# Patient Record
Sex: Male | Born: 1955
Health system: Southern US, Community
[De-identification: ages and names within clinical notes are randomized; demographics above are authoritative.]

## PROBLEM LIST (undated history)

## (undated) DIAGNOSIS — G4733 Obstructive sleep apnea (adult) (pediatric): Secondary | ICD-10-CM

## (undated) DIAGNOSIS — E785 Hyperlipidemia, unspecified: Secondary | ICD-10-CM

## (undated) DIAGNOSIS — L570 Actinic keratosis: Secondary | ICD-10-CM

## (undated) HISTORY — DX: Actinic keratosis: L57.0

## (undated) HISTORY — PX: VASECTOMY: SHX75

## (undated) HISTORY — DX: Hyperlipidemia, unspecified: E78.5

## (undated) HISTORY — DX: Obstructive sleep apnea (adult) (pediatric): G47.33

---

## 1996-10-31 HISTORY — PX: NASAL SEPTUM SURGERY: SHX37

## 2003-04-10 ENCOUNTER — Ambulatory Visit (HOSPITAL_BASED_OUTPATIENT_CLINIC_OR_DEPARTMENT_OTHER): Admission: RE | Admit: 2003-04-10 | Discharge: 2003-04-10 | Payer: Self-pay | Admitting: Pulmonary Disease

## 2003-08-06 ENCOUNTER — Encounter: Payer: Self-pay | Admitting: Pulmonary Disease

## 2003-08-06 ENCOUNTER — Observation Stay (HOSPITAL_COMMUNITY): Admission: EM | Admit: 2003-08-06 | Discharge: 2003-08-07 | Payer: Self-pay | Admitting: Pulmonary Disease

## 2005-05-26 ENCOUNTER — Ambulatory Visit: Payer: Self-pay | Admitting: Pulmonary Disease

## 2006-06-23 ENCOUNTER — Ambulatory Visit: Payer: Self-pay | Admitting: Pulmonary Disease

## 2007-01-11 ENCOUNTER — Ambulatory Visit: Payer: Self-pay | Admitting: Pulmonary Disease

## 2007-01-11 LAB — CONVERTED CEMR LAB
Cholesterol: 225 mg/dL (ref 0–200)
Direct LDL: 142.8 mg/dL
HDL: 44.7 mg/dL (ref >39.0)
Hgb A1c MFr Bld: 5.6 % (ref 4.6–6.0)
Total CHOL/HDL Ratio: 5
Triglycerides: 133 mg/dL (ref 0–149)
VLDL: 27 mg/dL (ref 0–40)

## 2007-06-28 ENCOUNTER — Ambulatory Visit: Payer: Self-pay | Admitting: Pulmonary Disease

## 2007-06-28 LAB — CONVERTED CEMR LAB
ALT: 39 units/L (ref 0–53)
AST: 28 units/L (ref 0–37)
Albumin: 4.1 g/dL (ref 3.5–5.2)
Alkaline Phosphatase: 58 units/L (ref 39–117)
BUN: 11 mg/dL (ref 6–23)
Basophils Absolute: 0 10*3/uL (ref 0.0–0.1)
Basophils Relative: 0.7 % (ref 0.0–1.0)
Bilirubin, Direct: 0.1 mg/dL (ref 0.0–0.3)
CO2: 33 meq/L — ABNORMAL HIGH (ref 19–32)
Calcium: 9.1 mg/dL (ref 8.4–10.5)
Chloride: 106 meq/L (ref 96–112)
Cholesterol: 212 mg/dL (ref 0–200)
Creatinine, Ser: 1.1 mg/dL (ref 0.4–1.5)
Direct LDL: 139.8 mg/dL
Eosinophils Absolute: 0.2 10*3/uL (ref 0.0–0.6)
Eosinophils Relative: 3.5 % (ref 0.0–5.0)
GFR calc Af Amer: 91 mL/min
GFR calc non Af Amer: 75 mL/min
Glucose, Bld: 121 mg/dL — ABNORMAL HIGH (ref 70–99)
HCT: 46.9 % (ref 39.0–52.0)
HDL: 44 mg/dL (ref >39.0)
Hemoglobin: 16 g/dL (ref 13.0–17.0)
Lymphocytes Relative: 27.3 % (ref 12.0–46.0)
MCHC: 34.2 g/dL (ref 30.0–36.0)
MCV: 90.7 fL (ref 78.0–100.0)
Monocytes Absolute: 0.4 10*3/uL (ref 0.2–0.7)
Monocytes Relative: 7.6 % (ref 3.0–11.0)
Neutro Abs: 2.9 10*3/uL (ref 1.4–7.7)
Neutrophils Relative %: 60.9 % (ref 43.0–77.0)
PSA: 0.92 ng/mL (ref 0.10–4.00)
Platelets: 286 10*3/uL (ref 150–400)
Potassium: 4.6 meq/L (ref 3.5–5.1)
RBC: 5.17 M/uL (ref 4.22–5.81)
RDW: 12.2 % (ref 11.5–14.6)
Sodium: 142 meq/L (ref 135–145)
TSH: 0.58 microintl units/mL (ref 0.35–5.50)
Total Bilirubin: 0.9 mg/dL (ref 0.3–1.2)
Total CHOL/HDL Ratio: 4.8
Total Protein: 7 g/dL (ref 6.0–8.3)
Triglycerides: 144 mg/dL (ref 0–149)
VLDL: 29 mg/dL (ref 0–40)
WBC: 4.8 10*3/uL (ref 4.5–10.5)

## 2007-07-20 ENCOUNTER — Ambulatory Visit: Payer: Self-pay

## 2008-05-15 DIAGNOSIS — R7301 Impaired fasting glucose: Secondary | ICD-10-CM | POA: Insufficient documentation

## 2008-05-16 ENCOUNTER — Ambulatory Visit: Payer: Self-pay | Admitting: Pulmonary Disease

## 2008-05-16 DIAGNOSIS — L570 Actinic keratosis: Secondary | ICD-10-CM | POA: Insufficient documentation

## 2008-05-16 DIAGNOSIS — G4733 Obstructive sleep apnea (adult) (pediatric): Secondary | ICD-10-CM

## 2008-09-03 ENCOUNTER — Encounter: Payer: Self-pay | Admitting: Pulmonary Disease

## 2008-10-31 HISTORY — PX: ROTATOR CUFF REPAIR: SHX139

## 2008-12-19 ENCOUNTER — Encounter: Payer: Self-pay | Admitting: Pulmonary Disease

## 2009-09-09 ENCOUNTER — Encounter: Payer: Self-pay | Admitting: Pulmonary Disease

## 2009-10-12 ENCOUNTER — Telehealth: Payer: Self-pay | Admitting: Pulmonary Disease

## 2009-10-14 ENCOUNTER — Ambulatory Visit: Payer: Self-pay | Admitting: Pulmonary Disease

## 2009-10-19 ENCOUNTER — Telehealth (INDEPENDENT_AMBULATORY_CARE_PROVIDER_SITE_OTHER): Payer: Self-pay | Admitting: *Deleted

## 2009-10-21 ENCOUNTER — Ambulatory Visit: Payer: Self-pay | Admitting: Pulmonary Disease

## 2009-10-26 LAB — CONVERTED CEMR LAB
ALT: 63 units/L — ABNORMAL HIGH (ref 0–53)
AST: 28 units/L (ref 0–37)
Albumin: 3.5 g/dL (ref 3.5–5.2)
Alkaline Phosphatase: 68 units/L (ref 39–117)
BUN: 13 mg/dL (ref 6–23)
Basophils Absolute: 0 10*3/uL (ref 0.0–0.1)
Basophils Relative: 0.5 % (ref 0.0–3.0)
Bilirubin Urine: NEGATIVE
Bilirubin, Direct: 0.1 mg/dL (ref 0.0–0.3)
CO2: 30 meq/L (ref 19–32)
Calcium: 9.2 mg/dL (ref 8.4–10.5)
Chloride: 104 meq/L (ref 96–112)
Cholesterol: 173 mg/dL (ref 0–200)
Creatinine, Ser: 1 mg/dL (ref 0.4–1.5)
Eosinophils Absolute: 0.2 10*3/uL (ref 0.0–0.7)
Eosinophils Relative: 4 % (ref 0.0–5.0)
GFR calc non Af Amer: 83.24 mL/min (ref >60)
Glucose, Bld: 107 mg/dL — ABNORMAL HIGH (ref 70–99)
HCT: 45.8 % (ref 39.0–52.0)
HDL: 33.1 mg/dL — ABNORMAL LOW (ref >39.00)
Hemoglobin: 15.9 g/dL (ref 13.0–17.0)
Ketones, ur: NEGATIVE mg/dL
LDL Cholesterol: 119 mg/dL — ABNORMAL HIGH (ref 0–99)
Leukocytes, UA: NEGATIVE
Lymphocytes Relative: 23.5 % (ref 12.0–46.0)
Lymphs Abs: 1.2 10*3/uL (ref 0.7–4.0)
MCHC: 34.6 g/dL (ref 30.0–36.0)
MCV: 92.2 fL (ref 78.0–100.0)
Monocytes Absolute: 0.6 10*3/uL (ref 0.1–1.0)
Monocytes Relative: 10.9 % (ref 3.0–12.0)
Neutro Abs: 3.1 10*3/uL (ref 1.4–7.7)
Neutrophils Relative %: 61.1 % (ref 43.0–77.0)
Nitrite: NEGATIVE
PSA: 0.68 ng/mL (ref 0.10–4.00)
Platelets: 237 10*3/uL (ref 150.0–400.0)
Potassium: 4.9 meq/L (ref 3.5–5.1)
RBC: 4.97 M/uL (ref 4.22–5.81)
RDW: 11.5 % (ref 11.5–14.6)
Sodium: 141 meq/L (ref 135–145)
Specific Gravity, Urine: 1.015 (ref 1.000–1.030)
TSH: 0.79 microintl units/mL (ref 0.35–5.50)
Total Bilirubin: 0.7 mg/dL (ref 0.3–1.2)
Total CHOL/HDL Ratio: 5
Total Protein, Urine: NEGATIVE mg/dL
Total Protein: 6.8 g/dL (ref 6.0–8.3)
Triglycerides: 104 mg/dL (ref 0.0–149.0)
Urine Glucose: NEGATIVE mg/dL
Urobilinogen, UA: 0.2 (ref 0.0–1.0)
VLDL: 20.8 mg/dL (ref 0.0–40.0)
WBC: 5.1 10*3/uL (ref 4.5–10.5)
pH: 6.5 (ref 5.0–8.0)

## 2010-01-12 ENCOUNTER — Encounter (INDEPENDENT_AMBULATORY_CARE_PROVIDER_SITE_OTHER): Payer: Self-pay | Admitting: *Deleted

## 2010-08-03 ENCOUNTER — Ambulatory Visit: Payer: Self-pay | Admitting: Pulmonary Disease

## 2010-08-03 ENCOUNTER — Encounter: Payer: Self-pay | Admitting: Pulmonary Disease

## 2010-08-04 LAB — CONVERTED CEMR LAB
ALT: 28 units/L (ref 0–53)
AST: 27 units/L (ref 0–37)
Albumin: 4.1 g/dL (ref 3.5–5.2)
Alkaline Phosphatase: 51 units/L (ref 39–117)
BUN: 14 mg/dL (ref 6–23)
Basophils Absolute: 0 10*3/uL (ref 0.0–0.1)
Basophils Relative: 0.3 % (ref 0.0–3.0)
Bilirubin Urine: NEGATIVE
Bilirubin, Direct: 0.2 mg/dL (ref 0.0–0.3)
CO2: 30 meq/L (ref 19–32)
Calcium: 9.2 mg/dL (ref 8.4–10.5)
Chloride: 105 meq/L (ref 96–112)
Cholesterol: 131 mg/dL (ref 0–200)
Creatinine, Ser: 1 mg/dL (ref 0.4–1.5)
Eosinophils Absolute: 0.2 10*3/uL (ref 0.0–0.7)
Eosinophils Relative: 3.1 % (ref 0.0–5.0)
GFR calc non Af Amer: 82.98 mL/min (ref >60)
Glucose, Bld: 107 mg/dL — ABNORMAL HIGH (ref 70–99)
HCT: 45.1 % (ref 39.0–52.0)
HDL: 50.9 mg/dL (ref >39.00)
Hemoglobin, Urine: NEGATIVE
Hemoglobin: 15.7 g/dL (ref 13.0–17.0)
Ketones, ur: NEGATIVE mg/dL
LDL Cholesterol: 67 mg/dL (ref 0–99)
Leukocytes, UA: NEGATIVE
Lymphocytes Relative: 26.7 % (ref 12.0–46.0)
Lymphs Abs: 1.3 10*3/uL (ref 0.7–4.0)
MCHC: 34.9 g/dL (ref 30.0–36.0)
MCV: 93 fL (ref 78.0–100.0)
Monocytes Absolute: 0.4 10*3/uL (ref 0.1–1.0)
Monocytes Relative: 7.6 % (ref 3.0–12.0)
Neutro Abs: 3.1 10*3/uL (ref 1.4–7.7)
Neutrophils Relative %: 62.3 % (ref 43.0–77.0)
Nitrite: NEGATIVE
PSA: 0.77 ng/mL (ref 0.10–4.00)
Platelets: 259 10*3/uL (ref 150.0–400.0)
Potassium: 4.6 meq/L (ref 3.5–5.1)
RBC: 4.85 M/uL (ref 4.22–5.81)
RDW: 12.8 % (ref 11.5–14.6)
Sodium: 140 meq/L (ref 135–145)
Specific Gravity, Urine: 1.015 (ref 1.000–1.030)
TSH: 0.61 microintl units/mL (ref 0.35–5.50)
Total Bilirubin: 0.9 mg/dL (ref 0.3–1.2)
Total CHOL/HDL Ratio: 3
Total Protein, Urine: NEGATIVE mg/dL
Total Protein: 6.9 g/dL (ref 6.0–8.3)
Triglycerides: 66 mg/dL (ref 0.0–149.0)
Urine Glucose: NEGATIVE mg/dL
Urobilinogen, UA: 0.2 (ref 0.0–1.0)
VLDL: 13.2 mg/dL (ref 0.0–40.0)
WBC: 4.9 10*3/uL (ref 4.5–10.5)
pH: 7.5 (ref 5.0–8.0)

## 2010-11-28 LAB — CONVERTED CEMR LAB
ALT: 37 units/L (ref 0–53)
AST: 25 units/L (ref 0–37)
Albumin: 4.3 g/dL (ref 3.5–5.2)
Alkaline Phosphatase: 49 units/L (ref 39–117)
BUN: 14 mg/dL (ref 6–23)
Basophils Absolute: 0 10*3/uL (ref 0.0–0.1)
Basophils Relative: 0.5 % (ref 0.0–3.0)
Bilirubin Urine: NEGATIVE
Bilirubin, Direct: 0.1 mg/dL (ref 0.0–0.3)
CO2: 31 meq/L (ref 19–32)
Calcium: 9.4 mg/dL (ref 8.4–10.5)
Chloride: 103 meq/L (ref 96–112)
Cholesterol: 135 mg/dL (ref 0–200)
Creatinine, Ser: 1 mg/dL (ref 0.4–1.5)
Crystals: NEGATIVE
Eosinophils Absolute: 0.1 10*3/uL (ref 0.0–0.7)
Eosinophils Relative: 3.1 % (ref 0.0–5.0)
GFR calc Af Amer: 101 mL/min
GFR calc non Af Amer: 84 mL/min
Glucose, Bld: 116 mg/dL — ABNORMAL HIGH (ref 70–99)
HCT: 47.5 % (ref 39.0–52.0)
HDL: 41.5 mg/dL (ref >39.0)
Hemoglobin, Urine: NEGATIVE
Hemoglobin: 16.3 g/dL (ref 13.0–17.0)
Ketones, ur: NEGATIVE mg/dL
LDL Cholesterol: 78 mg/dL (ref 0–99)
Leukocytes, UA: NEGATIVE
Lymphocytes Relative: 30.3 % (ref 12.0–46.0)
MCHC: 34.2 g/dL (ref 30.0–36.0)
MCV: 93.6 fL (ref 78.0–100.0)
Monocytes Absolute: 0.3 10*3/uL (ref 0.1–1.0)
Monocytes Relative: 6.8 % (ref 3.0–12.0)
Neutro Abs: 2.9 10*3/uL (ref 1.4–7.7)
Neutrophils Relative %: 59.3 % (ref 43.0–77.0)
Nitrite: NEGATIVE
PSA: 0.59 ng/mL (ref 0.10–4.00)
Platelets: 273 10*3/uL (ref 150–400)
Potassium: 4.4 meq/L (ref 3.5–5.1)
RBC: 5.08 M/uL (ref 4.22–5.81)
RDW: 12.2 % (ref 11.5–14.6)
Sodium: 139 meq/L (ref 135–145)
Specific Gravity, Urine: 1.015 (ref 1.000–1.03)
TSH: 0.7 microintl units/mL (ref 0.35–5.50)
Total Bilirubin: 1.2 mg/dL (ref 0.3–1.2)
Total CHOL/HDL Ratio: 3.3
Total Protein: 7.2 g/dL (ref 6.0–8.3)
Triglycerides: 79 mg/dL (ref 0–149)
Urine Glucose: NEGATIVE mg/dL
Urobilinogen, UA: 0.2 (ref 0.0–1.0)
VLDL: 16 mg/dL (ref 0–40)
WBC: 4.7 10*3/uL (ref 4.5–10.5)
pH: 7 (ref 5.0–8.0)

## 2010-11-30 NOTE — Assessment & Plan Note (Signed)
Summary: cpx/ mbw   CC:  Yearly ROV & CPX....  History of Present Illness: 55 y/o WM here for a follow up visit and CPX...    ~  Jul09:  he was last seen 8/08- see problem list below... he has had a good year, feels well, exercising regularly and has participated in several 5K runs... 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   ~  October 20, 2009:  another good yr without new complaints or concerns... he had right rotator cuff surg 9/10, post op phys therapy & doing fine now... he continues on nightly CPAP- doing well... he had colonoscopy earlier this yr by DrWoods in [place]> it was normal & f/u planned 62yrs.   ~  August 03, 2010:  he's had a good year, doing well w/o specific concerns or complaints... recent 68 mi bike ride w/ scouts- no CP, palpit, SOB, etc... weight up 5# to 203#, +FamHx DM, FBS= 107, & FLP looks great on Simva40.    Current Problem List:  PHYSICAL EXAMINATION (ICD-V70.0) - advised ASA 81mg /d... he had a neg FlexSig in 1997 by DrStark and had full colonoscopy 2010 by DrWoods in [place]> neg, normal w/o divertics or polyps.  OBSTRUCTIVE SLEEP APNEA (ICD-327.23) - Sleep Study 6/04 showed RDI= 10, with desat to 81%... symptoms improved w/ CPAP trial and set at 6cm pressure... ENT eval by DrShoemaker...  ~  12/10:  he reports using CPAP nightly, nasal pillows, no difficulty reported.  Family Hx of CAD (ICD-414.00) - both father & mother died in their early 60's w/ heart disease and CABG's...  we discussed risk factor reduction and careful monitoring over time...  ~  NuclearStressTest 10/04 was neg- no ischemia, no infarction, EF=61%...  ~  11/10:  he remains asymptomatic- no CP, palpit, SOB, edema, etc...  HYPERLIPIDEMIA (ICD-272.4) - on SIMVASTATIN 40mg /d...   ~  FLP 8/08 showed TChol 212, TG 144, HDL 44, LDL 140... on diet alone- Rec= start Simva40.  ~  FLP 7/09 (on Simva40) showed TChol 135, TG 79, HDL 42, LDL 78  ~  FLP 12/10 (on Simva40) showed TChol  173, TG 104, HDL 33, LDL 119  ~  FLP 10/11 showed TChol 131, TG 66, HDL 51, LDL 67  DIABETES MELLITUS, BORDERLINE (ICD-790.29) - +fam hx w/ 1 sis passed away w/ DM, ?hrt dis...  ~  labs 8/08 w/ FBS= 121... advised low carb diet etc... he reports home BS= 100-110 range.  ~  labs 7/09 showed BS= 116  ~  labs 12/10 showed BS= 107  ~  labs 10/11 showed BS= 107  Hx of ACTINIC KERATOSIS (ICD-702.0) - he is followed by Derm at [facility]...   Allergies (verified): No Known Drug Allergies  Comments:  Nurse/Medical Assistant: The patient's medications and allergies were reviewed with the patient and were updated in the Medication and Allergy Lists.  Past History:  Past Medical History: OBSTRUCTIVE SLEEP APNEA (ICD-327.23) Family Hx of CAD (ICD-414.00) HYPERLIPIDEMIA (ICD-272.4) DIABETES MELLITUS, BORDERLINE (ICD-790.29) Hx of ACTINIC KERATOSIS (ICD-702.0)  Past Surgical History: S/P nasal septoplasty in 1998 S/P right rotator cuff surg 2010  Family History: Reviewed history from 05/16/2008 and no changes required. Father died age 30 w/ ASHD/ CABG, emphysema/ smoker... Mother died age 29 w/ MI & CABG... 2 Sibs- 1Sis died w/ DM & ?heart disease, 1Sis A/W.  Social History: Reviewed history from 10/21/2009 and no changes required. pt has never smoked exposed to second hand smoke exercises 2-3 times daily  caffeine use 1 cup daily no alcohol use married 1 child  Review of Systems  The patient denies fever, chills, sweats, anorexia, fatigue, weakness, malaise, weight loss, sleep disorder, blurring, diplopia, eye irritation, eye discharge, vision loss, eye pain, photophobia, earache, ear discharge, tinnitus, decreased hearing, nasal congestion, nosebleeds, sore throat, hoarseness, chest pain, palpitations, syncope, dyspnea on exertion, orthopnea, PND, peripheral edema, cough, dyspnea at rest, excessive sputum, hemoptysis, wheezing, pleurisy, nausea, vomiting, diarrhea, constipation, change  in bowel habits, abdominal pain, melena, hematochezia, jaundice, gas/bloating, indigestion/heartburn, dysphagia, odynophagia, dysuria, hematuria, urinary frequency, urinary hesitancy, nocturia, incontinence, back pain, joint pain, joint swelling, muscle cramps, muscle weakness, stiffness, arthritis, sciatica, restless legs, leg pain at night, leg pain with exertion, rash, itching, dryness, suspicious lesions, paralysis, paresthesias, seizures, tremors, vertigo, transient blindness, frequent falls, frequent headaches, difficulty walking, depression, anxiety, memory loss, confusion, cold intolerance, heat intolerance, polydipsia, polyphagia, polyuria, unusual weight change, abnormal bruising, bleeding, enlarged lymph nodes, urticaria, allergic rash, hay fever, and recurrent infections.    Vital Signs:  Patient profile:   55 year old male Height:      71 inches Weight:      203 pounds BMI:     28.42 O2 Sat:      96 % on Room air Temp:     97 degrees F oral Pulse rate:   47 / minute BP sitting:   118 / 68  (right arm) Cuff size:   regular  Vitals Entered By: Abigail Miyamoto RN (August 03, 2010 9:05 AM)  O2 Flow:  Room air  Physical Exam  Additional Exam:  WD, WN, 55 y/o WM in NAD... GENERAL:  Alert & oriented; pleasant & cooperative... HEENT:  [place]/AT, EOM-wnl, PERRLA, Fundi-benign, EACs-clear, TMs-wnl, NOSE-clear, THROAT-clear & wnl. NECK:  Supple w/ full ROM; no JVD; normal carotid impulses w/o bruits; no thyromegaly or nodules palpated; no lymphadenopathy. CHEST:  Clear to P & A; without wheezes/ rales/ or rhonchi. HEART:  Regular Rhythm; without murmurs/ rubs/ or gallops. ABDOMEN:  Soft & nontender; normal bowel sounds; no organomegaly or masses detected. RECTAL:  Neg - prostate 2+ & nontender w/o nodules; stool hematest neg. EXT: without deformities or arthritic changes; no varicose veins/ venous insuffic/ or edema. s/p right rotator cuff surg... NEURO:  CN's intact; motor testing normal;  sensory testing normal; gait normal & balance OK. DERM:  No lesions noted; no rash etc >> followed by Derm at [facility]...    EKG  Procedure date:  08/03/2010  Findings:      Sinus bradycardia with rate of:  50/ min... rsr' c/w mild IVCD, otherw WNL, NAD...  SN   MISC. Report  Procedure date:  08/03/2010  Findings:      BMP (METABOL)   Sodium                    140 mEq/L                   135-145   Potassium                 4.6 mEq/L                   3.5-5.1   Chloride                  105 mEq/L                   96-112   Carbon Dioxide  30 mEq/L                    19-32   Glucose              [H]  107 mg/dL                   16-10   BUN                       14 mg/dL                    9-60   Creatinine                1.0 mg/dL                   4.5-4.0   Calcium                   9.2 mg/dL                   9.8-11.9   GFR                       82.98 mL/min                >60  CBC Platelet w/Diff (CBCD)   White Cell Count          4.9 K/uL                    4.5-10.5   Red Cell Count            4.85 Mil/uL                 4.22-5.81   Hemoglobin                15.7 g/dL                   14.7-82.9   Hematocrit                45.1 %                      39.0-52.0   MCV                       93.0 fl                     78.0-100.0   Platelet Count            259.0 K/uL                  150.0-400.0   Neutrophil %              62.3 %                      43.0-77.0   Lymphocyte %              26.7 %                      12.0-46.0   Monocyte %                7.6 %                       3.0-12.0  Eosinophils%              3.1 %                       0.0-5.0   Basophils %               0.3 %                       0.0-3.0  Hepatic/Liver Function Panel (HEPATIC)   Total Bilirubin           0.9 mg/dL                   1.6-1.0   Direct Bilirubin          0.2 mg/dL                   9.6-0.4   Alkaline Phosphatase      51 U/L                      39-117   AST                        27 U/L                      0-37   ALT                       28 U/L                      0-53   Total Protein             6.9 g/dL                    5.4-0.9   Albumin                   4.1 g/dL                    8.1-1.9  Comments:      TSH (TSH)   FastTSH                   0.61 uIU/mL                 0.35-5.50  Lipid Panel (LIPID)   Cholesterol               131 mg/dL                   1-478   Triglycerides             66.0 mg/dL                  2.9-562.1   HDL                       30.86 mg/dL                 >57.84   LDL Cholesterol           67 mg/dL                    6-96  UDip Only (UDIP)   Color  Yellow   Clarity                   CLEAR                       Clear   Specific Gravity          1.015                       1.000 - 1.030   Urine Ph                  7.5                         5.0-8.0   Protein                   NEGATIVE                    Negative   Urine Glucose             NEGATIVE                    Negative   Ketones                   NEGATIVE                    Negative   Urine Bilirubin           NEGATIVE                    Negative   Blood                     NEGATIVE                    Negative   Urobilinogen              0.2                         0.0 - 1.0   Leukocyte Esterace        NEGATIVE                    Negative   Nitrite                   NEGATIVE                    Negative  Prostate Specific Antigen (PSA)   PSA-Hyb                   0.77 ng/mL                  0.10-4.00   Impression & Recommendations:  Problem # 1:  PHYSICAL EXAMINATION (ICD-V70.0) Good general health & we discussed coronary risk factors... Orders: 12 Lead EKG (12 Lead EKG)  Problem # 2:  OBSTRUCTIVE SLEEP APNEA (ICD-327.23) Continue CPAP>  tol well...  Problem # 3:  HYPERLIPIDEMIA (ICD-272.4) Well controlled on diet, exercise, & the simva40... His updated medication list for this problem includes:    Simvastatin 40 Mg Tabs (Simvastatin)  .Marland Kitchen... Take 1 tablet by mouth once a day  Problem # 4:  DIABETES MELLITUS, BORDERLINE (ICD-790.29) +FamHx DM & his FBS is 107>  advised diet +  exercise...  Problem # 5:  OTHER MEDICAL ISSUES AS NOTED>>>  Complete Medication List: 1)  Cpap  .... Pressure = 6 cm... 2)  Adult Aspirin Ec Low Strength 81 Mg Tbec (Aspirin) .... Take 1 tablet by mouth once a day 3)  Simvastatin 40 Mg Tabs (Simvastatin) .... Take 1 tablet by mouth once a day  Patient Instructions: 1)  Today we updated your med list- see below.... 2)  Continue your current meds the same... 3)  Today we did your follow up EKG and FASTING blood work... please call the "phone tree" in a few days for your lab results.Marland KitchenMarland Kitchen 4)  Call for any problems.Marland KitchenMarland Kitchen 5)  Please schedule a follow-up appointment in 1 year. Prescriptions: SIMVASTATIN 40 MG  TABS (SIMVASTATIN) Take 1 tablet by mouth once a day  #90 x 4   Entered and Authorized by:   Michele Mcalpine MD   Signed by:   Michele Mcalpine MD on 08/03/2010   Method used:   Print then Give to Patient   RxID:   [id number]

## 2010-11-30 NOTE — Letter (Signed)
SummaryScience writer Pulmonary Care Appointment Letter  [facility]  [address]   [place], [place] [address]   Phone: [phone]  Fax: [phone]    01/12/2010 MRN: [id number]  Daniel Carpenter [address] [place], [place]  [address]  Dear [name],   Our office is attempting to contact you about an appointment.  Please call our office at [phone] to schedule this appointment with Dr. Alroy Dust.  Your appointment for May 21, 2010 has been cancelled.  Please call our office to reschedule.  Our registration staff is prepared to assist you with any questions you may have.    Thank you,   Nature conservation officer Pulmonary Division

## 2010-11-30 NOTE — Procedures (Signed)
Summary: Colonoscopy/[facility]  Colonoscopy/[facility]   Imported By: Sherian Rein 11/11/2009 13:46:14  _____________________________________________________________________  External Attachment:    Type:   Image     Comment:   External Document

## 2011-03-18 NOTE — H&P (Signed)
NAME:  Daniel Carpenter, Daniel Carpenter                        ACCOUNT NO.:  [id number]   MEDICAL RECORD NO.:  [id number]                   PATIENT TYPE:  INP   LOCATION:  0344                                 FACILITY:  [facility]   PHYSICIAN:  Daniel Carpenter, M.D. [facility]            DATE OF BIRTH:  1956/03/03   DATE OF ADMISSION:  08/06/2003  DATE OF DISCHARGE:                                HISTORY & PHYSICAL   CHIEF COMPLAINT:  Chest pain x 4 days.   HISTORY OF PRESENT ILLNESS:  This is a 55 year old, pleasant white male  patient of Dr. Kriste Carpenter who has a known history of some mild hyperlipidemia and  mild obstructive sleep apnea.  The patient presents for an acute office  visit related to a four-day history of intermitted mid chest and epigastric  pain.  The patient describes this as a burning sensation.  He has had a mild  decrease in symptoms with antacids; however, the pain never completely  resolved.  He denies any exertional symptoms, radiating pain, shortness of  breath, nausea, diaphoresis, palpitations.  The patient does have a  significant family history with a father who died at 42 with a history of  coronary artery bypass and also a mother who has had bypass surgery.  The  patient is currently having pain in the office setting.  He will require  hospitalization to rule out underlying cardiac disease.   PAST MEDICAL HISTORY:  1. Mild hyperlipidemia with a total cholesterol of 221, LDL 141.  2. Mild obstructive sleep apnea.   CURRENT MEDICATIONS:  None.   ALLERGIES:  No known drug allergies.   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   FAMILY HISTORY:  Positive coronary artery disease in father who died at age  62 with a history of CABG.  Mother also has a history of coronary artery  bypass grafting.  Sister has diabetes.   REVIEW OF SYSTEMS:  Taken in detail in HPI, otherwise  unremarkable.   PHYSICAL EXAMINATION:  GENERAL:  The patient is a well-developed, well-  nourished white male in no acute distress.  VITAL SIGNS: Temperature 97.4, blood pressure 140/64, respiratory rate 16,  O2 saturation 97% on room air.  Weight 187.  HEENT:  PERRLA.  Nasal mucosa pink and moist.  TMs clear.  Posterior pharynx  clear without exudate.  NECK:  Supple without cervical adenopathy.  No JVD.  Carotids are equal  bilaterally without bruits.  LUNGS: Sounds clear to auscultation without wheeze or crackles.  CARDIAC:  S1, S2 without murmur, rub, or gallop.  ABDOMEN:  Soft with positive bowel sounds through all four quadrants.  No  guarding noted.  No abdominal bruits or masses appreciated.  EXTREMITIES:  Warm without calf tenderness, cyanosis, clubbing, or edema.  Pulses are intact.  NEUROLOGIC:  No focal deficits.   LABORATORY DATA:  EKG revealed sinus bradycardia with a rate of 46.  There  are no acute changes noted.   IMPRESSION AND PLAN:  Atypical chest pain.  Need to rule out underlying  cardiac disease.  The patient will be admitted for hospitalization with  cardiac enzymes pending.  Cardiology consult as well.  The patient has been  started on aspirin therapy, oxygen, and nitroglycerin sublingual.  The  patient is also started on PPI therapy for possible underlying reflux.      Tammy Parrett, P.A. [facility]                   Scott M. Kriste Carpenter, M.D. [facility]    TP/MEDQ  D:  08/06/2003  T:  08/06/2003  Job:  [id number]

## 2011-03-18 NOTE — Consult Note (Signed)
NAME:  Daniel Carpenter, STANDAGE NO.:  [id number]   MEDICAL RECORD NO.:  [id number]                   PATIENT TYPE:  INP   LOCATION:                                       FACILITY:  [facility]   PHYSICIAN:  Doylene Canning. Ladona Ridgel, M.D.               DATE OF BIRTH:  1956/07/01   DATE OF CONSULTATION:  08/06/2003  DATE OF DISCHARGE:                                   CONSULTATION   INDICATION FOR CONSULTATION:  Evaluation of chest pain.   REFERRING PHYSICIAN:  Lonzo Cloud. Kriste Basque, M.D. [facility]   HISTORY OF PRESENT ILLNESS:  The patient is a 55 year old man who was  admitted to the hospital after being seen in the clinic today for evaluation  of chest pain.  He has no history of coronary disease.  The patient states  that he was in his usual state of health until approximately four days ago.  He notes that in the past he had had rare episodes of indigestion but he  noted that since Saturday his symptoms of indigestion would come and go and  would not always be totally relieved with antacids.  This was new for him.  He denied any associated relationship to exertion.  He denied any  relationship to food intake.  He denied associated nausea, vomiting,  diarrhea, or shortness of breath.  He has had no diaphoresis.  The pain  comes and goes lasting several minutes at a time.  He presented for  evaluation secondary to all of the above.  The patient denies any history of  palpitations or syncope.  He denies cough or hemoptysis.  The pain is not  typically pleuritic.  There is no relationship to position.  It does not  radiate into the left arm, neck, or jaw and is, in fact, limited to the mid  epigastric region alone.   PAST MEDICAL HISTORY:  Notable for very mild hypercholesterolemia.   FAMILY HISTORY:  There is a family history of coronary disease with both  mother and father having bypass surgery, his mother in her 40s and his  father in his 55s.  His father is deceased from  complications of COPD.  The  patient's family has a strong history of tobacco use.   SOCIAL HISTORY:  The patient is married.  He denies tobacco use.  He drinks  one to three alcoholic beverages per week by his report.   REVIEW OF SYSTEMS:  No vision or hearing problems.  No recent weight  changes.  He denies any difficulty swallowing.  Denies nausea, vomiting,  diarrhea, or constipation.  He denies chest pain, cough, hemoptysis, or  claudication.  He denies PND or orthopnea.  He denies any arthritic  complaints.  He denies any skin changes.  He denies any problems with gait  or balance or other neurologic symptoms.  He has had no weakness.  He denies  any  changes in his urinary habits and denies polyuria or polydipsia.  He  denies easy bruisability.  He is otherwise without complaint.  The rest of  his review of systems is negative.   PHYSICAL EXAMINATION:  GENERAL:  He is a pleasant, well-appearing 55-year-  old man in no distress.  VITAL SIGNS:  Blood pressure 131/78, pulse 56 and regular, respirations 18,  weight 185 pounds.  HEENT:  Normocephalic, atraumatic.  Pupils are equal and round.  The  oropharynx was moist.  The sclerae were anicteric.  NECK:  No jugular venous distention.  There was no thyromegaly.  The trachea  was midline.  Carotids were 2+ and symmetric.  There were no bruits or delay  in the pulses.  LUNGS:  Clear bilaterally to auscultation with no wheezes, rales, or  rhonchi.  CARDIOVASCULAR:  Regular rate and rhythm with normal S1 and S2.  I did not  appreciate a murmur, rub, or gallop.  ABDOMEN:  Soft, nontender, nondistended.  There was no organomegaly.  EXTREMITIES:  No clubbing, cyanosis, edema.  Pulses were 2+ and symmetric.  NEUROLOGIC:  Alert and oriented x3 with cranial nerves II-XII grossly  intact.  Strength 5/5, symmetric.   LABORATORIES:  His EKG demonstrates sinus bradycardia, otherwise normal.  His laboratory evaluation demonstrates initial  negative cardiac enzymes and  troponin.   IMPRESSION:  1. Atypical chest pain.  2. Borderline hyperlipidemia.   DISCUSSION:  The patient has initially negative cardiac enzymes despite four  days of intermittent pain.  His symptoms are very atypical.  There is no  significant EKG change.  There is partial improvement of his symptoms with  antacid and there is no associated shortness of breath, nausea, vomiting,  diaphoresis.  His pain is not exertional.  I have recommended that he  undergo serial cardiac enzymes.  If his pain is resolved and his enzymes  negative, then I would recommend discharge home with an exercise Cardiolite  done as an outpatient.  If his enzymes are positive or the pain continues to  have persistent pain despite antacids and hydrogen pump blockers, then I  would recommend catheterization for definitive diagnosis.  I would also  recommend treatment of hyperlipidemia if, in fact, this is a problem.  I  discussed all these issues with the patient and his wife.                                               Doylene Canning. Ladona Ridgel, M.D.    GWT/MEDQ  D:  08/06/2003  T:  08/07/2003  Job:  [id number]   cc:   Lonzo Cloud. Kriste Basque, M.D. [facility]

## 2011-08-08 ENCOUNTER — Encounter: Payer: Self-pay | Admitting: Pulmonary Disease

## 2011-08-16 ENCOUNTER — Telehealth: Payer: Self-pay | Admitting: Pulmonary Disease

## 2011-08-16 DIAGNOSIS — E785 Hyperlipidemia, unspecified: Secondary | ICD-10-CM

## 2011-08-16 DIAGNOSIS — R7309 Other abnormal glucose: Secondary | ICD-10-CM

## 2011-08-16 DIAGNOSIS — Z0000 Encounter for general adult medical examination without abnormal findings: Secondary | ICD-10-CM

## 2011-08-16 NOTE — Telephone Encounter (Signed)
Paper chart ordered.

## 2011-08-17 ENCOUNTER — Telehealth: Payer: Self-pay | Admitting: Pulmonary Disease

## 2011-08-17 NOTE — Telephone Encounter (Signed)
1.  Spoke w/ pt this morning.  Pt stated he got stuck w/ a rusty nail over the weekend & would like to know if he is okay or it a new tetanus shot is necessary.    2.  Pt has an appointment for his CPX w/  SN on 10/24. Pt is requesting to completed his labs first thing Friday, 10/19 am.  Antionette Fairy

## 2011-08-17 NOTE — Telephone Encounter (Signed)
Chart still reading to central file. Chart reordered to triage.

## 2011-08-17 NOTE — Telephone Encounter (Signed)
Error.  Added message to previous call.  Antionette Fairy

## 2011-08-17 NOTE — Telephone Encounter (Signed)
Tetanus given at 06/28/2007 OV (documentation in paper chart). LMOMTCB x 1.

## 2011-08-18 NOTE — Telephone Encounter (Signed)
Called and spoke with pt. Informed him of date of last tetanus injection.  And also informed him ok per leigh for pt to come in tomorrow AM to have bloodwork drawn prior to his cpx with SN on 10/24

## 2011-08-19 ENCOUNTER — Other Ambulatory Visit (INDEPENDENT_AMBULATORY_CARE_PROVIDER_SITE_OTHER): Payer: Self-pay

## 2011-08-19 DIAGNOSIS — Z0000 Encounter for general adult medical examination without abnormal findings: Secondary | ICD-10-CM

## 2011-08-19 DIAGNOSIS — R7309 Other abnormal glucose: Secondary | ICD-10-CM

## 2011-08-19 DIAGNOSIS — E785 Hyperlipidemia, unspecified: Secondary | ICD-10-CM

## 2011-08-19 LAB — CBC WITH DIFFERENTIAL/PLATELET
Eosinophils Relative: 3.8 % (ref 0.0–5.0)
HCT: 46.6 % (ref 39.0–52.0)
Lymphs Abs: 1.6 10*3/uL (ref 0.7–4.0)
Monocytes Relative: 7.4 % (ref 3.0–12.0)
Platelets: 259 10*3/uL (ref 150.0–400.0)
WBC: 5.4 10*3/uL (ref 4.5–10.5)

## 2011-08-19 LAB — URINALYSIS
Hgb urine dipstick: NEGATIVE
Nitrite: NEGATIVE
Urobilinogen, UA: 0.2 (ref 0.0–1.0)

## 2011-08-19 LAB — BASIC METABOLIC PANEL
Calcium: 9.2 mg/dL (ref 8.4–10.5)
GFR: 89.88 mL/min (ref >60.00)
Glucose, Bld: 111 mg/dL — ABNORMAL HIGH (ref 70–99)
Potassium: 4.3 mEq/L (ref 3.5–5.1)
Sodium: 140 mEq/L (ref 135–145)

## 2011-08-19 LAB — PSA: PSA: 0.72 ng/mL (ref 0.10–4.00)

## 2011-08-19 LAB — LIPID PANEL
LDL Cholesterol: 83 mg/dL (ref 0–99)
Total CHOL/HDL Ratio: 3
VLDL: 24 mg/dL (ref 0.0–40.0)

## 2011-08-19 LAB — HEPATIC FUNCTION PANEL
ALT: 25 U/L (ref 0–53)
AST: 20 U/L (ref 0–37)
Alkaline Phosphatase: 54 U/L (ref 39–117)
Bilirubin, Direct: 0.1 mg/dL (ref 0.0–0.3)
Total Bilirubin: 0.7 mg/dL (ref 0.3–1.2)

## 2011-08-23 ENCOUNTER — Encounter: Payer: Self-pay | Admitting: Pulmonary Disease

## 2011-08-24 ENCOUNTER — Encounter: Payer: Self-pay | Admitting: Pulmonary Disease

## 2011-08-24 ENCOUNTER — Ambulatory Visit (INDEPENDENT_AMBULATORY_CARE_PROVIDER_SITE_OTHER): Payer: BC Managed Care – PPO | Admitting: Pulmonary Disease

## 2011-08-24 VITALS — BP 122/84 | HR 60 | Temp 96.5°F | Ht 71.0 in | Wt 203.0 lb

## 2011-08-24 DIAGNOSIS — E785 Hyperlipidemia, unspecified: Secondary | ICD-10-CM

## 2011-08-24 DIAGNOSIS — G4733 Obstructive sleep apnea (adult) (pediatric): Secondary | ICD-10-CM

## 2011-08-24 DIAGNOSIS — Z0000 Encounter for general adult medical examination without abnormal findings: Secondary | ICD-10-CM

## 2011-08-24 MED ORDER — SIMVASTATIN 40 MG PO TABS: 40.0000 mg | ORAL_TABLET | Freq: Every day | ORAL | Status: DC

## 2011-08-24 NOTE — Patient Instructions (Signed)
Today we updated your med list in our EPIC system...    Continue your current medications the same...  Keep up the good work w/ your diet & exercise program...  Call for any problems...  Let's plan a similar recheck in 1 years time.Marland KitchenMarland Kitchen

## 2011-09-03 ENCOUNTER — Encounter: Payer: Self-pay | Admitting: Pulmonary Disease

## 2011-09-03 NOTE — Progress Notes (Signed)
Subjective:    Patient ID: Daniel Carpenter, male    DOB: Aug 05, 1956, 55 y.o.   MRN: [id number]  HPI 55 y/o WM here for a follow up visit and CPX...   ~  Jul09:  he was last seen 8/08- see problem list below... he has had a good year, feels well, exercising regularly and has participated in several 5K runs... 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  ~  October 20, 2009:  another good yr without new complaints or concerns... he had right rotator cuff surg 9/10, post op phys therapy & doing fine now... he continues on nightly CPAP- doing well... he had colonoscopy earlier this yr by DrWoods in [place]> it was normal & f/u planned 2yrs.  ~  August 03, 2010:  he's had a good year, doing well w/o specific concerns or complaints... recent 33 mi bike ride w/ scouts- no CP, palpit, SOB, etc... weight up 5# to 203#, +FamHx DM, FBS= 107, & FLP looks great on Simva40.  ~  August 24, 2011:  Yearly ROV & CPX> he has had another good yr; reports only prob w/ right knee, seen by Ortho [facility] DrMartin, DJD & prob needs arthroscopy but he is holding off, using OTC meds Prn...  Stable on CPAP nightly & no issues w/ mask;  Denies CP, palpit, dizzy, syncope, cerebral ischemic symptoms, SOB, edema, etc;  FLP looks great on Simva40, weight stable, sugar borderline (w/o change), etc...           Problem List:  PHYSICAL EXAMINATION (ICD-V70.0) - advised ASA 81mg /d... he had a neg FlexSig in 1997 by DrStark and had full colonoscopy 2010 by DrWoods in [place]> neg, normal w/o divertics or polyps. ~  CXR 12/10 clear, WNL, post op right shoulder... ~  EKG 10/12 showed SBrady, rate 58, WNL...  OBSTRUCTIVE SLEEP APNEA (ICD-327.23) - Sleep Study 6/04 showed RDI= 10, with desat to 81%... symptoms improved w/ CPAP trial and set at 6cm pressure... ENT eval by DrShoemaker... ~  10/12:  he reports using CPAP nightly, nasal pillows, no difficulty reported.  Family Hx of CAD (ICD-414.00) - both father & mother died in  their early 57's w/ heart disease and CABG's...  we discussed risk factor reduction and careful monitoring over time... ~  NuclearStressTest 10/04 was neg- no ischemia, no infarction, EF=61%... ~  10/12:  he remains asymptomatic- no CP, palpit, SOB, edema, etc...  HYPERLIPIDEMIA (ICD-272.4) - on SIMVASTATIN 40mg /d...  ~  FLP 8/08 showed TChol 212, TG 144, HDL 44, LDL 140... on diet alone- Rec= start Simva40. ~  FLP 7/09 (on Simva40) showed TChol 135, TG 79, HDL 42, LDL 78 ~  FLP 12/10 (on Simva40) showed TChol 173, TG 104, HDL 33, LDL 119 ~  FLP 10/11 showed TChol 131, TG 66, HDL 51, LDL 67 ~  FLP 10/12 on simva40 showed TChol 152, TG 120, HDL 45, LDL 83  DIABETES MELLITUS, BORDERLINE (ICD-790.29) - +fam hx w/ 1 sis passed away w/ DM, ?hrt dis... ~  labs 8/08 w/ FBS= 121... advised low carb diet etc... he reports home BS= 100-110 range. ~  labs 7/09 showed BS= 116 ~  labs 12/10 showed BS= 107 ~  labs 10/11 showed BS= 107 ~  Labs 10/12 showed BS= 111  Hx of ACTINIC KERATOSIS (ICD-702.0) - he is followed by Derm at [facility]...  HEALTH MAINTENANCE: ~  GI:  Age 89 now & he needs referral to GI for colonoscopy... ~  GU:  No LTOS, DRE is neg, PSA= 0.72 ~  Immuniz:  He gets the yearly Flu vaccines...   Past Surgical History  Procedure Date  . Nasal septum surgery 1998  . Rotator cuff repair 2010    right    Outpatient Encounter Prescriptions as of 08/24/2011  Medication Sig Dispense Refill  . aspirin 81 MG tablet Take 81 mg by mouth daily.        . simvastatin (ZOCOR) 40 MG tablet Take 1 tablet (40 mg total) by mouth at bedtime.  90 tablet  3    No Known Allergies   Current Medications, Allergies, Past Medical History, Past Surgical History, Family History, and Social History were reviewed in [employer] record.    Review of Systems    The patient denies fever, chills, sweats, anorexia, fatigue, weakness, malaise, weight loss, sleep disorder, blurring,  diplopia, eye irritation, eye discharge, vision loss, eye pain, photophobia, earache, ear discharge, tinnitus, decreased hearing, nasal congestion, nosebleeds, sore throat, hoarseness, chest pain, palpitations, syncope, dyspnea on exertion, orthopnea, PND, peripheral edema, cough, dyspnea at rest, excessive sputum, hemoptysis, wheezing, pleurisy, nausea, vomiting, diarrhea, constipation, change in bowel habits, abdominal pain, melena, hematochezia, jaundice, gas/bloating, indigestion/heartburn, dysphagia, odynophagia, dysuria, hematuria, urinary frequency, urinary hesitancy, nocturia, incontinence, back pain, joint pain, joint swelling, muscle cramps, muscle weakness, stiffness, arthritis, sciatica, restless legs, leg pain at night, leg pain with exertion, rash, itching, dryness, suspicious lesions, paralysis, paresthesias, seizures, tremors, vertigo, transient blindness, frequent falls, frequent headaches, difficulty walking, depression, anxiety, memory loss, confusion, cold intolerance, heat intolerance, polydipsia, polyphagia, polyuria, unusual weight change, abnormal bruising, bleeding, enlarged lymph nodes, urticaria, allergic rash, hay fever, and recurrent infections.     Objective:   Physical Exam     WD, WN, 55 y/o WM in NAD... GENERAL:  Alert & oriented; pleasant & cooperative... HEENT:  [place]/AT, EOM-wnl, PERRLA, Fundi-benign, EACs-clear, TMs-wnl, NOSE-clear, THROAT-clear & wnl. NECK:  Supple w/ full ROM; no JVD; normal carotid impulses w/o bruits; no thyromegaly or nodules palpated; no lymphadenopathy. CHEST:  Clear to P & A; without wheezes/ rales/ or rhonchi. HEART:  Regular Rhythm; without murmurs/ rubs/ or gallops. ABDOMEN:  Soft & nontender; normal bowel sounds; no organomegaly or masses detected. RECTAL:  Neg - prostate 2+ & nontender w/o nodules; stool hematest neg. EXT: without deformities or arthritic changes; no varicose veins/ venous insuffic/ or edema. s/p right rotator cuff  surg... NEURO:  CN's intact; motor testing normal; sensory testing normal; gait normal & balance OK. DERM:  No lesions noted; no rash etc >> followed by Derm at [facility]...  RADIOLOGY DATA:  Reviewed in the EPIC EMR & discussed w/ the patient...  LABORATORY DATA:  Reviewed in the EPIC EMR & discussed w/ the patient...   Assessment & Plan:   CPX>  Good general medical health; advised on low carb, low fat diet & exercise; he's already had the 2012 flu vaccine...  OSA>  Stable on CPAP w/o issues; continue same...  Fam Hx CAD>  Aware, pt remains asymptomatic, exercises regularly, doing well...  CHOL>  On Simva40 & FLP looks good; continue same...  Borderline DM>  +FamHx & FBS=111, discussed diet exercise etc...  DJD>  Notes some right knee problem & has seen Ortho in [place]; prev right rotator cuff surg 9/10 n [place] as well...  Other medical problems as listed.Marland KitchenMarland Kitchen

## 2012-08-27 ENCOUNTER — Telehealth: Payer: Self-pay | Admitting: Pulmonary Disease

## 2012-08-27 ENCOUNTER — Other Ambulatory Visit: Payer: Self-pay | Admitting: Pulmonary Disease

## 2012-08-27 DIAGNOSIS — Z0000 Encounter for general adult medical examination without abnormal findings: Secondary | ICD-10-CM

## 2012-08-27 NOTE — Telephone Encounter (Signed)
Pt is aware of labs in the computer.  Nothing further is needed 

## 2012-08-27 NOTE — Telephone Encounter (Signed)
Please advise SN thanks 

## 2012-08-28 ENCOUNTER — Encounter: Payer: Self-pay | Admitting: *Deleted

## 2012-08-28 ENCOUNTER — Other Ambulatory Visit (INDEPENDENT_AMBULATORY_CARE_PROVIDER_SITE_OTHER): Payer: 59

## 2012-08-28 DIAGNOSIS — Z0000 Encounter for general adult medical examination without abnormal findings: Secondary | ICD-10-CM

## 2012-08-28 LAB — BASIC METABOLIC PANEL
Chloride: 103 mEq/L (ref 96–112)
GFR: 77.84 mL/min (ref >60.00)
Potassium: 4.3 mEq/L (ref 3.5–5.1)
Sodium: 139 mEq/L (ref 135–145)

## 2012-08-28 LAB — URINALYSIS
Bilirubin Urine: NEGATIVE
Ketones, ur: NEGATIVE
Total Protein, Urine: NEGATIVE
Urine Glucose: NEGATIVE
pH: 6 (ref 5.0–8.0)

## 2012-08-28 LAB — CBC WITH DIFFERENTIAL/PLATELET
Basophils Relative: 0.4 % (ref 0.0–3.0)
Eosinophils Absolute: 0.2 10*3/uL (ref 0.0–0.7)
HCT: 47.9 % (ref 39.0–52.0)
Lymphs Abs: 1.5 10*3/uL (ref 0.7–4.0)
MCHC: 33.6 g/dL (ref 30.0–36.0)
MCV: 92.5 fl (ref 78.0–100.0)
Monocytes Absolute: 0.4 10*3/uL (ref 0.1–1.0)
Neutrophils Relative %: 62.6 % (ref 43.0–77.0)
Platelets: 252 10*3/uL (ref 150.0–400.0)

## 2012-08-28 LAB — LIPID PANEL
Cholesterol: 167 mg/dL (ref 0–200)
LDL Cholesterol: 101 mg/dL — ABNORMAL HIGH (ref 0–99)
Total CHOL/HDL Ratio: 4

## 2012-08-28 LAB — HEPATIC FUNCTION PANEL
ALT: 28 U/L (ref 0–53)
AST: 22 U/L (ref 0–37)
Bilirubin, Direct: 0.2 mg/dL (ref 0.0–0.3)
Total Bilirubin: 1.2 mg/dL (ref 0.3–1.2)

## 2012-08-28 LAB — TSH: TSH: 0.73 u[IU]/mL (ref 0.35–5.50)

## 2012-08-29 ENCOUNTER — Encounter: Payer: Self-pay | Admitting: Pulmonary Disease

## 2012-08-29 ENCOUNTER — Ambulatory Visit (INDEPENDENT_AMBULATORY_CARE_PROVIDER_SITE_OTHER): Payer: 59 | Admitting: Pulmonary Disease

## 2012-08-29 ENCOUNTER — Ambulatory Visit (INDEPENDENT_AMBULATORY_CARE_PROVIDER_SITE_OTHER)
Admission: RE | Admit: 2012-08-29 | Discharge: 2012-08-29 | Disposition: A | Discharge status: Home / Self Care | Payer: 59 | Source: Ambulatory Visit | Attending: Pulmonary Disease | Admitting: Pulmonary Disease

## 2012-08-29 VITALS — BP 116/74 | HR 68 | Temp 96.8°F | Ht 71.0 in | Wt 202.4 lb

## 2012-08-29 DIAGNOSIS — G4733 Obstructive sleep apnea (adult) (pediatric): Secondary | ICD-10-CM

## 2012-08-29 DIAGNOSIS — Z0000 Encounter for general adult medical examination without abnormal findings: Secondary | ICD-10-CM

## 2012-08-29 DIAGNOSIS — E785 Hyperlipidemia, unspecified: Secondary | ICD-10-CM

## 2012-08-29 DIAGNOSIS — R7309 Other abnormal glucose: Secondary | ICD-10-CM

## 2012-08-29 MED ORDER — OMEGA-3 FATTY ACIDS 1000 MG PO CAPS: 2.0000 g | ORAL_CAPSULE | Freq: Every day | ORAL | Status: DC

## 2012-08-29 MED ORDER — VITAMIN D 1000 UNITS PO TABS: 1000.0000 [IU] | ORAL_TABLET | Freq: Every day | ORAL | Status: DC

## 2012-08-29 MED ORDER — VITAMIN C 500 MG PO TABS: 1000.0000 mg | ORAL_TABLET | Freq: Every day | ORAL | Status: DC

## 2012-08-29 MED ORDER — VITAMIN B-12 500 MCG PO TABS: 500.0000 ug | ORAL_TABLET | Freq: Every day | ORAL | Status: DC

## 2012-08-29 MED ORDER — SIMVASTATIN 40 MG PO TABS: 40.0000 mg | ORAL_TABLET | Freq: Every day | ORAL | Status: DC

## 2012-08-29 MED ORDER — GLUCOSAMINE 1500 COMPLEX PO CAPS: 2.0000 | ORAL_CAPSULE | Freq: Every day | ORAL | Status: DC

## 2012-08-29 MED ORDER — ASPIRIN 81 MG PO TABS: 81.0000 mg | ORAL_TABLET | Freq: Every day | ORAL | Status: DC

## 2012-08-29 NOTE — Patient Instructions (Addendum)
Today we updated your med list in our EPIC system...    Continue your current medications the same...    We refilled your meds per request...  Today we reviewed your recent blood work & gave you a copy... We also did your follow up CXR & EKG...    We will communicate those results when avail...  Call for any questions...  Let's get on track w/ our low cholesterol low fat diet.Marland KitchenMarland Kitchen

## 2012-08-29 NOTE — Progress Notes (Signed)
Subjective:    Patient ID: Daniel Carpenter, male    DOB: 1956-08-04, 56 y.o.   MRN: [id number]  HPI 56 y/o WM here for a follow up visit and CPX...   ~  Jul09:  he was last seen 8/08- see problem list below... he has had a good year, feels well, exercising regularly and has participated in several 5K runs... 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  ~  October 20, 2009:  another good yr without new complaints or concerns... he had right rotator cuff surg 9/10, post op phys therapy & doing fine now... he continues on nightly CPAP- doing well... he had colonoscopy earlier this yr by DrWoods in [place]> it was normal & f/u planned 34yrs.  ~  August 03, 2010:  he's had a good year, doing well w/o specific concerns or complaints... recent 33 mi bike ride w/ sc[employer]- no CP, palpit, SOB, etc... weight up 5# to 203#, +FamHx DM, FBS= 107, & FLP looks great on Simva40.  ~  August 24, 2011:  Yearly ROV & CPX> he has had another good yr; reports only prob w/ right knee, seen by Ortho [facility] DrMartin, DJD & prob needs arthroscopy but he is holding off, using OTC meds Prn...  Stable on CPAP nightly & no issues w/ mask;  Denies CP, palpit, dizzy, syncope, cerebral ischemic symptoms, SOB, edema, etc;  FLP looks great on Simva40, weight stable, sugar borderline (w/o change), etc...   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  We reviewed the following medical problems during today's office visit>>     OSA> see below- stable on CPAP at 6cm pressure, uses regularly, rests well, no issues...    Pos FamHx CAD> both parents had CAD & CABG's, both died in their early 65's; he had neg Myoview 2004; he remains asymptomatic & we reviewed risk factor reduction strategy; he will also incr his exercise program...    Hyperlipid> on Simva40; FLP shows TChol 167, TG  104, HDL 46, LDL 101    BorderlineBS> on diet alone; Labs showed BS= 108 and he knows to avoid sweets etc...    He is up-to-date on colonoscopy screening> done 2010 by DrWood in [place] & reported wnl- no polyps, divertics, etc...    Exam has noted atrophic testes> he is asymptomatic, feels well, good energy, libido ok, no performance issues, etc...     DJD> he has had some knee pain & saw an Ortho at [facility]; he reports improved...    Hx Actinic Keratoses> followed at [facility]... We reviewed prob list, meds, xrays and labs> see below for updates >> ok Flu shot today... CXR 10/13 showed normal heart size, clear lungs, WNL.Marland KitchenMarland Kitchen EKG 10/13 showed SBrady, rate 59, WNL, NAD... LABS 10/13:  FLP- at goal on Simva40;  Chems- ok x BS=108;  CBC- wnl;  TSH=0.73;  PSA=1.13;  UA- clear...           Problem List:  PHYSICAL EXAMINATION (ICD-V70.0) - advised ASA 81mg /d... he had a neg FlexSig in 1997 by DrStark and had full colonoscopy 2010 by DrWoods in [place]> neg, normal w/o divertics or polyps. ~  CXR 12/10 clear, WNL, post op right shoulder... ~  EKG 10/12 showed SBrady, rate 58, WNL.Marland Kitchen. ~  CXR 10/13 showed normal heart  size, clear lungs, WNL... ~  EKG 10/13 showed SBrady, rate 59, WNL, NAD...  OBSTRUCTIVE SLEEP APNEA (ICD-327.23) - Sleep Study 6/04 showed RDI= 10, with desat to 81%... symptoms improved w/ CPAP trial and set at 6cm pressure... ENT eval by DrShoemaker... ~  10/12 & 10/13:  he reports using CPAP nightly, nasal pillows, no difficulty reported.  Family Hx of CAD (ICD-414.00) - both father & mother died in their early 57's w/ heart disease and CABG's...  we discussed risk factor reduction and careful monitoring over time... ~  NuclearStressTest 10/04 was neg- no ischemia, no infarction, EF=61%... ~  10/12 & 10/13:  he remains asymptomatic- no CP, palpit, SOB, edema, etc...  HYPERLIPIDEMIA (ICD-272.4) - on SIMVASTATIN 40mg /d...  ~  FLP 8/08 showed TChol 212, TG 144, HDL 44, LDL 140... on diet alone-  Rec= start Simva40. ~  FLP 7/09 (on Simva40) showed TChol 135, TG 79, HDL 42, LDL 78 ~  FLP 12/10 (on Simva40) showed TChol 173, TG 104, HDL 33, LDL 119 ~  FLP 10/11 showed TChol 131, TG 66, HDL 51, LDL 67 ~  FLP 10/12 on Simva40 showed TChol 152, TG 120, HDL 45, LDL 83 ~  FLP 10/13 on Simva40 showed TChol 167, TG 104, HDL 46, LDL 101  DIABETES MELLITUS, BORDERLINE (ICD-790.29) - +fam hx w/ 1 sis passed away w/ DM, ?hrt dis... ~  labs 8/08 w/ FBS= 121... advised low carb diet etc... he reports home BS= 100-110 range. ~  labs 7/09 showed BS= 116 ~  labs 12/10 showed BS= 107 ~  labs 10/11 showed BS= 107 ~  Labs 10/12 showed BS= 111 ~  Labs 10/13 showed BS= 108  DJD >> Notes some right knee problem & has seen Ortho in [place]; prev right rotator cuff surg 9/10 n [place] as well...  Hx of ACTINIC KERATOSIS (ICD-702.0) - he is followed by Derm at [facility]...  HEALTH MAINTENANCE: ~  GI:  Age 50 now & he needs referral to GI for colonoscopy... ~  GU:  No LTOS, DRE is neg, PSA= 0.72 ~  Immuniz:  He gets the yearly Flu vaccines...   Past Surgical History  Procedure Date  . Nasal septum surgery 1998  . Rotator cuff repair 2010    right    Outpatient Encounter Prescriptions as of 08/29/2012  Medication Sig Dispense Refill  . aspirin 81 MG tablet Take 81 mg by mouth daily.        . cholecalciferol (VITAMIN D) 1000 UNITS tablet Take 1,000 Units by mouth daily.      . fish oil-omega-3 fatty acids 1000 MG capsule Take 2 g by mouth daily.      . Glucosamine-Chondroit-Vit C-Mn (GLUCOSAMINE 1500 COMPLEX PO) Take 2 tablets by mouth daily.      . simvastatin (ZOCOR) 40 MG tablet Take 1 tablet (40 mg total) by mouth at bedtime.  90 tablet  3  . vitamin B-12 (CYANOCOBALAMIN) 500 MCG tablet Take 500 mcg by mouth daily.      . vitamin C (ASCORBIC ACID) 500 MG tablet Take 1,000 mg by mouth daily.        No Known Allergies   Current Medications, Allergies, Past Medical History, Past Surgical History, Family  History, and Social History were reviewed in [employer] record.    Review of Systems     The patient denies fever, chills, sweats, anorexia, fatigue, weakness, malaise, weight loss, sleep disorder, blurring, diplopia, eye irritation, eye discharge, vision loss, eye  pain, photophobia, earache, ear discharge, tinnitus, decreased hearing, nasal congestion, nosebleeds, sore throat, hoarseness, chest pain, palpitations, syncope, dyspnea on exertion, orthopnea, PND, peripheral edema, cough, dyspnea at rest, excessive sputum, hemoptysis, wheezing, pleurisy, nausea, vomiting, diarrhea, constipation, change in bowel habits, abdominal pain, melena, hematochezia, jaundice, gas/bloating, indigestion/heartburn, dysphagia, odynophagia, dysuria, hematuria, urinary frequency, urinary hesitancy, nocturia, incontinence, back pain, joint pain, joint swelling, muscle cramps, muscle weakness, stiffness, arthritis, sciatica, restless legs, leg pain at night, leg pain with exertion, rash, itching, dryness, suspicious lesions, paralysis, paresthesias, seizures, tremors, vertigo, transient blindness, frequent falls, frequent headaches, difficulty walking, depression, anxiety, memory loss, confusion, cold intolerance, heat intolerance, polydipsia, polyphagia, polyuria, unusual weight change, abnormal bruising, bleeding, enlarged lymph nodes, urticaria, allergic rash, hay fever, and recurrent infections.     Objective:   Physical Exam     WD, WN, 56 y/o WM in NAD... GENERAL:  Alert & oriented; pleasant & cooperative... HEENT:  [place]/AT, EOM-wnl, PERRLA, Fundi-benign, EACs-clear, TMs-wnl, NOSE-clear, THROAT-clear & wnl. NECK:  Supple w/ full ROM; no JVD; normal carotid impulses w/o bruits; no thyromegaly or nodules palpated; no lymphadenopathy. CHEST:  Clear to P & A; without wheezes/ rales/ or rhonchi. HEART:  Regular Rhythm; without murmurs/ rubs/ or gallops. ABDOMEN:  Soft & nontender; normal bowel  sounds; no organomegaly or masses detected. RECTAL:  Neg - prostate 2+ & nontender w/o nodules; stool hematest neg. EXT: without deformities or arthritic changes; no varicose veins/ venous insuffic/ or edema. s/p right rotator cuff surg... NEURO:  CN's intact; motor testing normal; sensory testing normal; gait normal & balance OK. DERM:  No lesions noted; no rash etc >> [facility]erm at [facility]...  RADIOLOGY DATA:  Reviewed in the EPIC EMR & discussed w/ the patient...  LABORATORY DATA:  Reviewed in the EPIC EMR & discussed w/ the patient...   Assessment & Plan:    CPX>  Good general medical health; advised on low carb, low fat diet & exercise; he's already had the 2012 flu vaccine...  OSA>  Stable on CPAP w/o issues; continue same...  Fam Hx CAD>  Aware, pt remains asymptomatic, exercises regularly, doing well...  CHOL>  On Simva40 & FLP looks good; continue same...  Borderline DM>  +FamHx & FBS=108, discussed diet exercise etc...  DJD>  Notes some right knee problem & has seen Ortho in [place]; prev right rotator cuff surg 9/10 n [place] as well...  Other medical problems as listed...   Patient's Medications  New Prescriptions   GLUCOSAMINE-CHONDROIT-VIT C-MN (GLUCOSAMINE 1500 COMPLEX) CAPS    Take 2 capsules by mouth daily.  Previous Medications   No medications on file  Modified Medications   Modified Medication Previous Medication   ASPIRIN 81 MG TABLET aspirin 81 MG tablet      Take 1 tablet (81 mg total) by mouth daily.    Take 81 mg by mouth daily.     CHOLECALCIFEROL (VITAMIN D) 1000 UNITS TABLET cholecalciferol (VITAMIN D) 1000 UNITS tablet      Take 1 tablet (1,000 Units total) by mouth daily.    Take 1,000 Units by mouth daily.   FISH OIL-OMEGA-3 FATTY ACIDS 1000 MG CAPSULE fish oil-omega-3 fatty acids 1000 MG capsule      Take 2 capsules (2 g total) by mouth daily.    Take 2 g by mouth daily.   SIMVASTATIN (ZOCOR) 40 MG TABLET simvastatin (ZOCOR) 40 MG tablet      Take  1 tablet (40 mg total) by mouth at bedtime.  Take 1 tablet (40 mg total) by mouth at bedtime.   VITAMIN B-12 (CYANOCOBALAMIN) 500 MCG TABLET vitamin B-12 (CYANOCOBALAMIN) 500 MCG tablet      Take 1 tablet (500 mcg total) by mouth daily.    Take 500 mcg by mouth daily.   VITAMIN C (ASCORBIC ACID) 500 MG TABLET vitamin C (ASCORBIC ACID) 500 MG tablet      Take 2 tablets (1,000 mg total) by mouth daily.    Take 1,000 mg by mouth daily.  Discontinued Medications   GLUCOSAMINE-CHONDROIT-VIT C-MN (GLUCOSAMINE 1500 COMPLEX PO)    Take 2 tablets by mouth daily.

## 2013-08-29 ENCOUNTER — Encounter: Payer: Self-pay | Admitting: Pulmonary Disease

## 2013-08-29 ENCOUNTER — Other Ambulatory Visit (INDEPENDENT_AMBULATORY_CARE_PROVIDER_SITE_OTHER): Payer: 59

## 2013-08-29 ENCOUNTER — Ambulatory Visit (INDEPENDENT_AMBULATORY_CARE_PROVIDER_SITE_OTHER): Payer: 59 | Admitting: Pulmonary Disease

## 2013-08-29 VITALS — BP 114/80 | HR 54 | Temp 97.5°F | Ht 71.0 in | Wt 193.8 lb

## 2013-08-29 DIAGNOSIS — E785 Hyperlipidemia, unspecified: Secondary | ICD-10-CM

## 2013-08-29 DIAGNOSIS — G4733 Obstructive sleep apnea (adult) (pediatric): Secondary | ICD-10-CM

## 2013-08-29 DIAGNOSIS — Z0000 Encounter for general adult medical examination without abnormal findings: Secondary | ICD-10-CM

## 2013-08-29 DIAGNOSIS — R7309 Other abnormal glucose: Secondary | ICD-10-CM

## 2013-08-29 LAB — LIPID PANEL
Cholesterol: 185 mg/dL (ref 0–200)
HDL: 52 mg/dL (ref >39.00)
LDL Cholesterol: 110 mg/dL — ABNORMAL HIGH (ref 0–99)
Total CHOL/HDL Ratio: 4
VLDL: 23 mg/dL (ref 0.0–40.0)

## 2013-08-29 LAB — CBC WITH DIFFERENTIAL/PLATELET
Basophils Absolute: 0 10*3/uL (ref 0.0–0.1)
HCT: 48.9 % (ref 39.0–52.0)
Hemoglobin: 17 g/dL (ref 13.0–17.0)
Lymphs Abs: 1.6 10*3/uL (ref 0.7–4.0)
MCV: 90.5 fl (ref 78.0–100.0)
Monocytes Absolute: 0.4 10*3/uL (ref 0.1–1.0)
Monocytes Relative: 7.3 % (ref 3.0–12.0)
Neutro Abs: 3.1 10*3/uL (ref 1.4–7.7)
Neutrophils Relative %: 59.5 % (ref 43.0–77.0)
Platelets: 264 10*3/uL (ref 150.0–400.0)
RDW: 12.5 % (ref 11.5–14.6)

## 2013-08-29 LAB — BASIC METABOLIC PANEL
BUN: 18 mg/dL (ref 6–23)
Calcium: 9.6 mg/dL (ref 8.4–10.5)
Chloride: 101 mEq/L (ref 96–112)
GFR: 91.48 mL/min (ref >60.00)
Glucose, Bld: 116 mg/dL — ABNORMAL HIGH (ref 70–99)
Potassium: 4.9 mEq/L (ref 3.5–5.1)
Sodium: 138 mEq/L (ref 135–145)

## 2013-08-29 LAB — TSH: TSH: 0.65 u[IU]/mL (ref 0.35–5.50)

## 2013-08-29 LAB — HEPATIC FUNCTION PANEL
ALT: 36 U/L (ref 0–53)
Albumin: 4.2 g/dL (ref 3.5–5.2)
Alkaline Phosphatase: 54 U/L (ref 39–117)
Bilirubin, Direct: 0.2 mg/dL (ref 0.0–0.3)
Total Bilirubin: 1 mg/dL (ref 0.3–1.2)

## 2013-08-29 MED ORDER — MELOXICAM 15 MG PO TABS: 15.0000 mg | ORAL_TABLET | Freq: Every day | ORAL | Status: DC | PRN

## 2013-08-29 NOTE — Progress Notes (Signed)
Subjective:    Patient ID: Daniel Carpenter, male    DOB: [birth year], 57 y.o.   MRN: [id number]  HPI 57 y/o WM here for a follow up visit and CPX...   ~  August 24, 2011:  Yearly ROV & CPX> he has had another good yr; reports only prob w/ right knee, seen by Ortho [facility] DrMartin, DJD & prob needs arthroscopy but he is holding off, using OTC meds Prn...  Stable on CPAP nightly & no issues w/ mask;  Denies CP, palpit, dizzy, syncope, cerebral ischemic symptoms, SOB, edema, etc;  FLP looks great on Simva40, weight stable, sugar borderline (w/o change), etc...   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  We reviewed the following medical problems during today's office visit>>     OSA> see below- stable on CPAP at 6cm pressure, uses regularly, rests well, no issues...    Pos FamHx CAD> both parents had CAD & CABG's, both died in their early 57's; he had neg Myoview 2004; he remains asymptomatic & we reviewed risk factor reduction strategy; he will also incr his exercise program...    Hyperlipid> on Simva40; FLP shows TChol 167, TG 104, HDL 46, LDL 101    BorderlineBS> on diet alone; Labs showed BS= 108 and he knows to avoid sweets etc...    He is up-to-date on colonoscopy screening> done 2010 by DrWood in [place] & reported wnl- no polyps, divertics, etc...    Exam has noted atrophic testes> he is asymptomatic, feels well, good energy, libido ok, no performance issues, etc...     DJD> he has had some knee pain & saw an Ortho at [facility]; he reports improved...    Hx Actinic Keratoses> followed at [facility]... We reviewed prob list, meds, xrays and labs> see below for updates >> ok Flu shot today... CXR 10/13 showed normal heart size, clear lungs, WNL.Marland KitchenMarland Kitchen EKG 10/13 showed SBrady, rate 59, WNL, NAD... LABS 10/13:  FLP- at goal on Simva40;  Chems- ok x BS=108;  CBC-  wnl;  TSH=0.73;  PSA=1.13;  UA- clear...  ~  August 29, 2013:  Yearly ROV & CPX> Daniel Carpenter only complaint is pain in his thumbs bilat, tender MCPs, and we discussed Mobic15, hot soaks, refer to Ortho is not responding... We reviewed the following medical problems during today's office visit >>     OSA> see below- stable on CPAP at 6cm pressure, uses regularly, rests well, no issues; he is due for an upgrade & we will contact [facility]...    Pos FamHx CAD> both parents had CAD & CABG's, both died in their early 53's; he had neg Myoview 2004; he remains asymptomatic & we reviewed risk factor reduction strategy; he will also incr his exercise program...    Hyperlipid> on Simva40; FLP 10/14 shows TChol 185, TG 115, HDL 52, LDL 110; Rec same med, better diet & exercise...    BorderlineBS> on diet alone; Labs 10/14 showed BS= 116 and he knows to avoid sweets etc...    He is up-to-date on colonoscopy screening> done 2010 by DrWood in [place] & reported wnl- no polyps, divertics, etc...    Exam has noted atrophic testes> he is asymptomatic, feels well, good energy, libido ok, no performance issues, etc...     DJD> on Glucosamine; he has had some  knee pain & saw an Ortho at [facility]; he reports improved; we wrote for Mobic15 to try for thumbs etc...    Hx Actinic Keratoses> followed at [facility]... We reviewed prob list, meds, xrays and labs> see below for updates >> he takes several Vits & supplements... He does Yoga work-out 2d/wk... LABS 10/14:  FLP- ok x LDL=110 on Simva40;  Chems- ok x BS=116;  CBC- wnl;  TSH=0.65;  PSA=0.95...           Problem List:  PHYSICAL EXAMINATION (ICD-V70.0) - advised ASA 81mg /d... he had a neg FlexSig in 1997 by DrStark and had full colonoscopy 2010 by DrWoods in [place]> neg, normal w/o divertics or polyps. ~  CXR 12/10 clear, WNL, post op right shoulder... ~  EKG 10/12 showed SBrady, rate 58, WNL.Marland Kitchen. ~  CXR 10/13 showed normal heart size, clear lungs, WNL... ~  EKG 10/13 showed SBrady,  rate 59, WNL, NAD...  OBSTRUCTIVE SLEEP APNEA (ICD-327.23) - Sleep Study 6/04 showed RDI= 10, with desat to 81%... symptoms improved w/ CPAP trial and set at 6cm pressure... ENT eval by DrShoemaker... ~  10/12 & 10/13:  he reports using CPAP nightly, nasal pillows, no difficulty reported. ~  10/14: he continues to do well, reports that he is resting well, should be due for an upgrade & we will contact [facility]...  Family Hx of CAD (ICD-414.00) - both father & mother died in their early 57's w/ heart disease and CABG's...  we discussed risk factor reduction and careful monitoring over time... ~  NuclearStressTest 10/04 was neg- no ischemia, no infarction, EF=61%... ~  10/12 - 10/14:  he remains asymptomatic- no CP, palpit, SOB, edema, etc...  HYPERLIPIDEMIA (ICD-272.4) - on SIMVASTATIN 40mg /d...  ~  FLP 8/08 showed TChol 212, TG 144, HDL 44, LDL 140... on diet alone- Rec= start Simva40. ~  FLP 7/09 (on Simva40) showed TChol 135, TG 79, HDL 42, LDL 78 ~  FLP 12/10 (on Simva40) showed TChol 173, TG 104, HDL 33, LDL 119 ~  FLP 10/11 showed TChol 131, TG 66, HDL 51, LDL 67 ~  FLP 10/12 on Simva40 showed TChol 152, TG 120, HDL 45, LDL 83 ~  FLP 10/13 on Simva40 showed TChol 167, TG 104, HDL 46, LDL 101 ~  FLP 10/14 on Simva40 showed TChol 185, TG 115, HDL 52, LDL 110... Rec- same med, better diet & exercise.  DIABETES MELLITUS, BORDERLINE (ICD-790.29) - +fam hx w/ 1 sis passed away w/ DM, ?hrt dis... ~  labs 8/08 w/ FBS= 121... advised low carb diet etc... he reports home BS= 100-110 range. ~  labs 7/09 showed BS= 116 ~  labs 12/10 showed BS= 107 ~  labs 10/11 showed BS= 107 ~  Labs 10/12 showed BS= 111 ~  Labs 10/13 on diet alone (wt=202#) showed BS= 108 ~  Labs 10/14 on diet alone (wt is down 8# to 194#) showed BS= 116  DJD >> Notes some right knee problem & has seen Ortho in [place]; prev right rotator cuff surg 9/10 n [place] as well... ~  10/14: c/o pain at MCPs of thumbs- rec for heat, Mobic15, refer  to Ortho if not responding...  Hx of ACTINIC KERATOSIS (ICD-702.0) - he is followed by Derm at [facility]...  HEALTH MAINTENANCE: ~  GI:  Age 56 now & he needs referral to GI for colonoscopy... ~  GU:  No LTOS, DRE is neg, PSA= 0.72 ~  Immuniz:  He gets the yearly Flu vaccines.Marland KitchenMarland Kitchen  Past Surgical History  Procedure Laterality Date  . Nasal septum surgery  1998  . Rotator cuff repair  2010    right    Outpatient Encounter Prescriptions as of 08/29/2013  Medication Sig Dispense Refill  . aspirin 81 MG tablet Take 1 tablet (81 mg total) by mouth daily.  90 tablet  3  . cholecalciferol (VITAMIN D) 1000 UNITS tablet Take 1 tablet (1,000 Units total) by mouth daily.  90 tablet  3  . fish oil-omega-3 fatty acids 1000 MG capsule Take 2 capsules (2 g total) by mouth daily.  90 capsule  3  . Glucosamine-Chondroit-Vit C-Mn (GLUCOSAMINE 1500 COMPLEX) CAPS Take 2 capsules by mouth daily.  180 capsule  3  . simvastatin (ZOCOR) 40 MG tablet Take 1 tablet (40 mg total) by mouth at bedtime.  90 tablet  3  . vitamin B-12 (CYANOCOBALAMIN) 500 MCG tablet Take 1 tablet (500 mcg total) by mouth daily.  90 tablet  3  . vitamin C (ASCORBIC ACID) 500 MG tablet Take 2 tablets (1,000 mg total) by mouth daily.  90 tablet  3   No facility-administered encounter medications on file as of 08/29/2013.    No Known Allergies   Current Medications, Allergies, Past Medical History, Past Surgical History, Family History, and Social History were reviewed in [employer] record.    Review of Systems    The patient denies fever, chills, sweats, anorexia, fatigue, weakness, malaise, weight loss, sleep disorder, blurring, diplopia, eye irritation, eye discharge, vision loss, eye pain, photophobia, earache, ear discharge, tinnitus, decreased hearing, nasal congestion, nosebleeds, sore throat, hoarseness, chest pain, palpitations, syncope, dyspnea on exertion, orthopnea, PND, peripheral edema, cough, dyspnea  at rest, excessive sputum, hemoptysis, wheezing, pleurisy, nausea, vomiting, diarrhea, constipation, change in bowel habits, abdominal pain, melena, hematochezia, jaundice, gas/bloating, indigestion/heartburn, dysphagia, odynophagia, dysuria, hematuria, urinary frequency, urinary hesitancy, nocturia, incontinence, back pain, joint pain, joint swelling, muscle cramps, muscle weakness, stiffness, arthritis, sciatica, restless legs, leg pain at night, leg pain with exertion, rash, itching, dryness, suspicious lesions, paralysis, paresthesias, seizures, tremors, vertigo, transient blindness, frequent falls, frequent headaches, difficulty walking, depression, anxiety, memory loss, confusion, cold intolerance, heat intolerance, polydipsia, polyphagia, polyuria, unusual weight change, abnormal bruising, bleeding, enlarged lymph nodes, urticaria, allergic rash, hay fever, and recurrent infections.     Objective:   Physical Exam     WD, WN, 57 y/o WM in NAD... GENERAL:  Alert & oriented; pleasant & cooperative... HEENT:  [place]/AT, EOM-wnl, PERRLA, Fundi-benign, EACs-clear, TMs-wnl, NOSE-clear, THROAT-clear & wnl. NECK:  Supple w/ full ROM; no JVD; normal carotid impulses w/o bruits; no thyromegaly or nodules palpated; no lymphadenopathy. CHEST:  Clear to P & A; without wheezes/ rales/ or rhonchi. HEART:  Regular Rhythm; without murmurs/ rubs/ or gallops. ABDOMEN:  Soft & nontender; normal bowel sounds; no organomegaly or masses detected. RECTAL:  Neg - prostate 2+ & nontender w/o nodules; stool hematest neg. EXT: without deformities or arthritic changes; no varicose veins/ venous insuffic/ or edema. s/p right rotator cuff surg... NEURO:  CN's intact; motor testing normal; sensory testing normal; gait normal & balance OK. DERM:  No lesions noted; no rash etc >> [facility] Derm [facility]...  RADIOLOGY DATA:  Reviewed in the EPIC EMR & discussed w/ the patient...  LABORATORY DATA:  Reviewed in the EPIC EMR &  discussed w/ the patient...   Assessment & Plan:    CPX>  Good general medical health; advised on low carb, low fat diet & exercise; he's  already had the 2014 flu vaccine...  OSA>  Stable on CPAP w/o issues; continue same...  Fam Hx CAD>  Aware, pt remains asymptomatic, exercises regularly, doing well...  CHOL>  On Simva40 & FLP looks OK; continue same...  Borderline DM>  +FamHx & FBS=116, discussed diet exercise etc...  DJD>  Notes some right knee problem & bilat thumb pain; has seen Ortho in [place]; try Mobic15; prev right rotator cuff surg 9/10 n [place] as well...  Other medical problems as listed...   Patient's Medications  New Prescriptions   MELOXICAM (MOBIC) 15 MG TABLET    Take 1 tablet (15 mg total) by mouth daily as needed for pain.  Previous Medications   ASPIRIN 81 MG TABLET    Take 1 tablet (81 mg total) by mouth daily.   CHOLECALCIFEROL (VITAMIN D) 1000 UNITS TABLET    Take 1 tablet (1,000 Units total) by mouth daily.   FISH OIL-OMEGA-3 FATTY ACIDS 1000 MG CAPSULE    Take 2 capsules (2 g total) by mouth daily.   GLUCOSAMINE-CHONDROIT-VIT C-MN (GLUCOSAMINE 1500 COMPLEX) CAPS    Take 2 capsules by mouth daily.   SIMVASTATIN (ZOCOR) 40 MG TABLET    Take 1 tablet (40 mg total) by mouth at bedtime.   VITAMIN B-12 (CYANOCOBALAMIN) 500 MCG TABLET    Take 1 tablet (500 mcg total) by mouth daily.   VITAMIN C (ASCORBIC ACID) 500 MG TABLET    Take 2 tablets (1,000 mg total) by mouth daily.  Modified Medications   No medications on file  Discontinued Medications   No medications on file

## 2013-08-29 NOTE — Patient Instructions (Signed)
Today we updated your med list in our EPIC system...    Continue your current medications the same...    We refilled your meds per request...  We wrote a new prescription for Meloxicam 15mg  to try one tab daily as needed for arthritis pain...  We will look into a new CPAP machine for you...  Keep up the good work w/ diet & exercise (yoga etc)...  Call for any questions...  Let's plan a follow up visit in 74yr, sooner if needed for problems.Marland KitchenMarland Kitchen

## 2013-08-30 NOTE — Progress Notes (Signed)
Quick Note:  Pt made aware of results. No questions or concerns. ______

## 2013-09-02 ENCOUNTER — Telehealth: Payer: Self-pay | Admitting: Pulmonary Disease

## 2013-09-02 NOTE — Telephone Encounter (Signed)
I spoke with pt. He reports he nor his wife were giving his results. He was told his results would be on [facility]. I advised pt of results. He had no questions and results released to his mycahrt. Nothing further needed

## 2013-09-02 NOTE — Telephone Encounter (Signed)
lmomtcb x1 for pt Do not see where we have called pt since speaking with him friday

## 2013-10-07 ENCOUNTER — Other Ambulatory Visit: Payer: Self-pay | Admitting: Pulmonary Disease

## 2014-04-22 ENCOUNTER — Other Ambulatory Visit (INDEPENDENT_AMBULATORY_CARE_PROVIDER_SITE_OTHER): Payer: 59

## 2014-04-22 ENCOUNTER — Ambulatory Visit (INDEPENDENT_AMBULATORY_CARE_PROVIDER_SITE_OTHER): Payer: 59 | Admitting: Pulmonary Disease

## 2014-04-22 ENCOUNTER — Encounter: Payer: Self-pay | Admitting: Pulmonary Disease

## 2014-04-22 VITALS — BP 124/76 | HR 56 | Temp 97.8°F | Ht 71.0 in | Wt 195.6 lb

## 2014-04-22 DIAGNOSIS — R7309 Other abnormal glucose: Secondary | ICD-10-CM

## 2014-04-22 DIAGNOSIS — E785 Hyperlipidemia, unspecified: Secondary | ICD-10-CM

## 2014-04-22 DIAGNOSIS — G4733 Obstructive sleep apnea (adult) (pediatric): Secondary | ICD-10-CM

## 2014-04-22 DIAGNOSIS — R361 Hematospermia: Secondary | ICD-10-CM

## 2014-04-22 LAB — URINALYSIS
Bilirubin Urine: NEGATIVE
HGB URINE DIPSTICK: NEGATIVE
Ketones, ur: NEGATIVE
Leukocytes, UA: NEGATIVE
NITRITE: NEGATIVE
Specific Gravity, Urine: 1.02 (ref 1.000–1.030)
Total Protein, Urine: NEGATIVE
Urine Glucose: NEGATIVE
Urobilinogen, UA: 0.2 (ref 0.0–1.0)
pH: 7 (ref 5.0–8.0)

## 2014-04-22 NOTE — Patient Instructions (Signed)
Today we updated your med list in our EPIC system...     We decided to HOLD your Aspirin for now...  We sent you to the lab for a Urinalysis...    We will contact you w/ the results when available...   We will arrange for a Urology consultation as we discussed...  Call for any questions.Marland Kitchen..Marland Kitchen

## 2014-04-22 NOTE — Progress Notes (Signed)
Subjective:    Patient ID: Daniel Carpenter, male    DOB: December 28, 1955, 58 y.o.   MRN: [id number]  HPI 58 y/o WM here for a follow up visit and CPX...   ~  August 24, 2011:  Yearly ROV & CPX> he has had another good yr; reports only prob w/ right knee, seen by Ortho [facility] Daniel Carpenter, DJD & prob needs arthroscopy but he is holding off, using OTC meds Prn...  Stable on CPAP nightly & no issues w/ mask;  Denies CP, palpit, dizzy, syncope, cerebral ischemic symptoms, SOB, edema, etc;  FLP looks great on Simva40, weight stable, sugar borderline (w/o change), etc...   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  We reviewed the following medical problems during today's office visit>>     OSA> see below- stable on CPAP at 6cm pressure, uses regularly, rests well, no issues...    Pos FamHx CAD> both parents had CAD & CABG's, both died in their early 47's; he had neg Myoview 2004; he remains asymptomatic & we reviewed risk factor reduction strategy; he will also incr his exercise program...    Hyperlipid> on Simva40; FLP shows TChol 167, TG 104, HDL 46, LDL 101    BorderlineBS> on diet alone; Labs showed BS= 108 and he knows to avoid sweets etc...    He is up-to-date on colonoscopy screening> done 2010 by Daniel Carpenter in [place] & reported wnl- no polyps, divertics, etc...    Exam has noted atrophic testes> he is asymptomatic, feels well, good energy, libido ok, no performance issues, etc...     DJD> he has had some knee pain & saw an Ortho at [facility]; he reports improved...    Hx Actinic Keratoses> followed at [facility]... We reviewed prob list, meds, xrays and labs> see below for updates >> ok Flu shot today... CXR 10/13 showed normal heart size, clear lungs, WNL.Marland KitchenMarland Kitchen EKG 10/13 showed SBrady, rate 59, WNL, NAD... LABS 10/13:  FLP- at goal on Simva40;  Chems- ok x BS=108;  CBC-  wnl;  TSH=0.73;  PSA=1.13;  UA- clear...  ~  August 29, 2013:  Yearly ROV & CPX> [name]'s only complaint is pain in his thumbs bilat, tender MCPs, and we discussed Mobic15, hot soaks, refer to Ortho is not responding... We reviewed the following medical problems during today's office visit >>     OSA> see below- stable on CPAP at 6cm pressure, uses regularly, rests well, no issues; he is due for an upgrade & we will contact [facility]...    Pos FamHx CAD> both parents had CAD & CABG's, both died in their early 38's; he had neg Myoview 2004; he remains asymptomatic & we reviewed risk factor reduction strategy; he will also incr his exercise program...    Hyperlipid> on Simva40; FLP 10/14 shows TChol 185, TG 115, HDL 52, LDL 110; Rec same med, better diet & exercise...    BorderlineBS> on diet alone; Labs 10/14 showed BS= 116 and he knows to avoid sweets etc...    He is up-to-date on colonoscopy screening> done 2010 by Daniel Carpenter in [place] & reported wnl- no polyps, divertics, etc...    Exam has noted atrophic testes> he is asymptomatic, feels well, good energy, libido ok, no performance issues, etc...     DJD> on Glucosamine; he has had some  knee pain & saw an Ortho at [facility]; he reports improved; we wrote for Mobic15 to try for thumbs etc...    Hx Actinic Keratoses> followed at [facility]... We reviewed prob list, meds, xrays and labs> see below for updates >> he takes several Vits & supplements... He does Yoga work-out 2d/wk... LABS 10/14:  FLP- ok x LDL=110 on Simva40;  Chems- ok x BS=116;  CBC- wnl;  TSH=0.65;  PSA=0.95...  ~  April 21, 2014:  Add-on appt due to epis of blood in semen> he has seen this over 2 occasions in the last few weeks; denies blood in urine, no pain, no drainage, no f/c/s... He had a vasectomy 20+ yrs ago, no known sequellae... He is on ASA daily for primary prophylaxis... Prev exams have revealed atrophic testes> he is asymptomatic, feels well, good energy, libido ok, no performance issues,  etc... Urinalysis is clear... Rec to HOLD his ASA & we will refer to Urology for reassurance & any further eval if needed...            Problem List:  PHYSICAL EXAMINATION (ICD-V70.0) - advised ASA 81mg /d... he had a neg FlexSig in 1997 by Daniel Carpenter and had full colonoscopy 2010 by Daniel Carpenter in [place]> neg, normal w/o divertics or polyps. ~  CXR 12/10 clear, WNL, post op right shoulder... ~  EKG 10/12 showed SBrady, rate 58, WNL.Marland Kitchen. ~  CXR 10/13 showed normal heart size, clear lungs, WNL... ~  EKG 10/13 showed SBrady, rate 59, WNL, NAD...  OBSTRUCTIVE SLEEP APNEA (ICD-327.23) - Sleep Study 6/04 showed RDI= 10, with desat to 81%... symptoms improved w/ CPAP trial and set at 6cm pressure... ENT eval by Daniel Carpenter... ~  10/12 & 10/13:  he reports using CPAP nightly, nasal pillows, no difficulty reported. ~  10/14: he continues to do well, reports that he is resting well, should be due for an upgrade & we will contact [facility]...  Family Hx of CAD (ICD-414.00) - both father & mother died in their early 32's w/ heart disease and CABG's...  we discussed risk factor reduction and careful monitoring over time... ~  NuclearStressTest 10/04 was neg- no ischemia, no infarction, EF=61%... ~  10/12 - 10/14:  he remains asymptomatic- no CP, palpit, SOB, edema, etc...  HYPERLIPIDEMIA (ICD-272.4) - on SIMVASTATIN 40mg /d...  ~  FLP 8/08 showed TChol 212, TG 144, HDL 44, LDL 140... on diet alone- Rec= start Simva40. ~  FLP 7/09 (on Simva40) showed TChol 135, TG 79, HDL 42, LDL 78 ~  FLP 12/10 (on Simva40) showed TChol 173, TG 104, HDL 33, LDL 119 ~  FLP 10/11 showed TChol 131, TG 66, HDL 51, LDL 67 ~  FLP 10/12 on Simva40 showed TChol 152, TG 120, HDL 45, LDL 83 ~  FLP 10/13 on Simva40 showed TChol 167, TG 104, HDL 46, LDL 101 ~  FLP 10/14 on Simva40 showed TChol 185, TG 115, HDL 52, LDL 110... Rec- same med, better diet & exercise.  DIABETES MELLITUS, BORDERLINE (ICD-790.29) - +fam hx w/ 1 sis passed away w/ DM,  ?hrt dis... ~  labs 8/08 w/ FBS= 121... advised low carb diet etc... he reports home BS= 100-110 range. ~  labs 7/09 showed BS= 116 ~  labs 12/10 showed BS= 107 ~  labs 10/11 showed BS= 107 ~  Labs 10/12 showed BS= 111 ~  Labs 10/13 on diet alone (wt=202#) showed BS= 108 ~  Labs 10/14 on diet alone (wt is down 8# to 194#) showed BS= 116  Hematospermia >> 2 episodes 6/15, otherw neg hx & exam, s/p vasectomy 20+yrs ago, urinalysis is neg, asked to HOLD ASA & we will refer to Urology...  DJD >> Notes some right knee problem & has seen Ortho in [place]; prev right rotator cuff surg 9/10 n [place] as well... ~  10/14: c/o pain at MCPs of thumbs- rec for heat, Mobic15, refer to Ortho if not responding...  Hx of ACTINIC KERATOSIS (ICD-702.0) - [facility]...  HEALTH MAINTENANCE: ~  GI:  Age 58 now & he needs referral to GI for colonoscopy... ~  GU:  No LTOS, DRE is neg, PSA= 0.72 ~  Immuniz:  He gets the yearly Flu vaccines...   Past Surgical History  Procedure Laterality Date  . Nasal septum surgery  1998  . Rotator cuff repair  2010    right    Outpatient Encounter Prescriptions as of 04/22/2014  Medication Sig  . aspirin 81 MG tablet Take 1 tablet (81 mg total) by mouth daily.  . cholecalciferol (VITAMIN D) 1000 UNITS tablet Take 1 tablet (1,000 Units total) by mouth daily.  . fish oil-omega-3 fatty acids 1000 MG capsule Take 2 capsules (2 g total) by mouth daily.  . Glucosamine-Chondroit-Vit C-Mn (GLUCOSAMINE 1500 COMPLEX) CAPS Take 2 capsules by mouth daily.  . meloxicam (MOBIC) 15 MG tablet Take 1 tablet (15 mg total) by mouth daily as needed for pain.  . simvastatin (ZOCOR) 40 MG tablet Take 1 tablet by mouth  every night at bedtime  . vitamin B-12 (CYANOCOBALAMIN) 500 MCG tablet Take 1 tablet (500 mcg total) by mouth daily.  . vitamin C (ASCORBIC ACID) 500 MG tablet Take 2 tablets (1,000 mg total) by mouth daily.    No Known Allergies   Current Medications,  Allergies, Past Medical History, Past Surgical History, Family History, and Social History were reviewed in [facility] electronic medical record.    Review of Systems    The patient denies fever, chills, sweats, anorexia, fatigue, weakness, malaise, weight loss, sleep disorder, blurring, diplopia, eye irritation, eye discharge, vision loss, eye pain, photophobia, earache, ear discharge, tinnitus, decreased hearing, nasal congestion, nosebleeds, sore throat, hoarseness, chest pain, palpitations, syncope, dyspnea on exertion, orthopnea, PND, peripheral edema, cough, dyspnea at rest, excessive sputum, hemoptysis, wheezing, pleurisy, nausea, vomiting, diarrhea, constipation, change in bowel habits, abdominal pain, melena, hematochezia, jaundice, gas/bloating, indigestion/heartburn, dysphagia, odynophagia, dysuria, hematuria, urinary frequency, urinary hesitancy, nocturia, incontinence, back pain, joint pain, joint swelling, muscle cramps, muscle weakness, stiffness, arthritis, sciatica, restless legs, leg pain at night, leg pain with exertion, rash, itching, dryness, suspicious lesions, paralysis, paresthesias, seizures, tremors, vertigo, transient blindness, frequent falls, frequent headaches, difficulty walking, depression, anxiety, memory loss, confusion, cold intolerance, heat intolerance, polydipsia, polyphagia, polyuria, unusual weight change, abnormal bruising, bleeding, enlarged lymph nodes, urticaria, allergic rash, hay fever, and recurrent infections.     Objective:   Physical Exam     WD, WN, 58 y/o WM in NAD... GENERAL:  Alert & oriented; pleasant & cooperative... HEENT:  [place]/AT, EOM-wnl, PERRLA, Fundi-benign, EACs-clear, TMs-wnl, NOSE-clear, THROAT-clear & wnl. NECK:  Supple w/ full ROM; no JVD; normal carotid impulses w/o bruits; no thyromegaly or nodules palpated; no lymphadenopathy. CHEST:  Clear to P & A; without wheezes/ rales/ or rhonchi. HEART:  Regular Rhythm; without murmurs/  rubs/ or gallops. ABDOMEN:  Soft & nontender; normal bowel sounds; no organomegaly or masses detected. RECTAL:  Neg - prostate 2+ & nontender w/o nodules; stool hematest neg. EXT: without  deformities or arthritic changes; no varicose veins/ venous insuffic/ or edema. s/p right rotator cuff surg... NEURO:  CN's intact; motor testing normal; sensory testing normal; gait normal & balance OK. DERM:  No lesions noted; no rash etc >> [facility]...  RADIOLOGY DATA:  Reviewed in the EPIC EMR & discussed w/ the patient...  LABORATORY DATA:  Reviewed in the EPIC EMR & discussed w/ the patient...   Assessment & Plan:    Blood in semen>  Add-on appt for episode of blood in semen x2 ejaculates in a row, exam is negative, urine sent to lab, he will HOLD ASA81, we will refer to Urology... Hx sl atrophic testes but he denies Low-T symptoms and we have not pursued this diagnosis in the past... Hx vasectomy 20+ yrs ago w/o sequelae...  OSA>  Stable on CPAP w/o issues; continue same...  Fam Hx CAD>  Aware, pt remains asymptomatic, exercises regularly, doing well...  CHOL>  On Simva40 & FLP looks OK; continue same...  Borderline DM>  +FamHx & FBS=116, discussed diet exercise etc...  DJD>  Notes some right knee problem & bilat thumb pain; has seen Ortho in [place]; try Mobic15; prev right rotator cuff surg 9/10 [place] as well...  Other medical problems as listed...   Patient's Medications  New Prescriptions   No medications on file  Previous Medications   CHOLECALCIFEROL (VITAMIN D) 1000 UNITS TABLET    Take 1 tablet (1,000 Units total) by mouth daily.   FISH OIL-OMEGA-3 FATTY ACIDS 1000 MG CAPSULE    Take 2 capsules (2 g total) by mouth daily.   GLUCOSAMINE-CHONDROIT-VIT C-MN (GLUCOSAMINE 1500 COMPLEX) CAPS    Take 2 capsules by mouth daily.   MELOXICAM (MOBIC) 15 MG TABLET    Take 1 tablet (15 mg total) by mouth daily as needed for pain.   SIMVASTATIN (ZOCOR) 40 MG TABLET    Take 1  tablet by mouth  every night at bedtime   VITAMIN B-12 (CYANOCOBALAMIN) 500 MCG TABLET    Take 1 tablet (500 mcg total) by mouth daily.   VITAMIN C (ASCORBIC ACID) 500 MG TABLET    Take 2 tablets (1,000 mg total) by mouth daily.  Modified Medications   Modified Medication Previous Medication   ASPIRIN 81 MG TABLET aspirin 81 MG tablet      Take 81 mg by mouth daily. HOLD    Take 1 tablet (81 mg total) by mouth daily.  Discontinued Medications   No medications on file

## 2014-06-26 ENCOUNTER — Other Ambulatory Visit: Payer: Self-pay | Admitting: Pulmonary Disease

## 2014-08-13 ENCOUNTER — Telehealth: Payer: Self-pay | Admitting: Pulmonary Disease

## 2014-08-13 NOTE — Telephone Encounter (Signed)
Called and spoke with pt and he stated that last October when he was seen SN gave him 2 rx.  He stated that one was for a replacement cpap.  He stated that he has lost this and would like to be able to get another rx for this since his is acting up.  Pt is aware that SN will be back in the office tomorrow and i will discuss with him and call the pt once this is ready.  SN please advise. Thanks  No Known Allergies  Current Outpatient Prescriptions on File Prior to Visit  Medication Sig Dispense Refill  . aspirin 81 MG tablet Take 81 mg by mouth daily. HOLD      . cholecalciferol (VITAMIN D) 1000 UNITS tablet Take 1 tablet (1,000 Units total) by mouth daily.  90 tablet  3  . fish oil-omega-3 fatty acids 1000 MG capsule Take 2 capsules (2 g total) by mouth daily.  90 capsule  3  . Glucosamine-Chondroit-Vit C-Mn (GLUCOSAMINE 1500 COMPLEX) CAPS Take 2 capsules by mouth daily.  180 capsule  3  . meloxicam (MOBIC) 15 MG tablet Take 1 tablet (15 mg total) by mouth daily as needed for pain.  30 tablet  6  . simvastatin (ZOCOR) 40 MG tablet Take 1 tablet by mouth  every night at bedtime  90 tablet  1  . vitamin B-12 (CYANOCOBALAMIN) 500 MCG tablet Take 1 tablet (500 mcg total) by mouth daily.  90 tablet  3  . vitamin C (ASCORBIC ACID) 500 MG tablet Take 2 tablets (1,000 mg total) by mouth daily.  90 tablet  3   No current facility-administered medications on file prior to visit.

## 2014-08-14 NOTE — Telephone Encounter (Signed)
Spoke with pt .  He prefers an rx be signed and mailed to him.  Rx written out and mailed to pt per his request.

## 2014-08-14 NOTE — Telephone Encounter (Signed)
Per SN---  Does he want us to write the rx for the cpap machine or does he want us to send the order in to his DME company?  We can do whatever it is the pt wants.

## 2014-09-01 ENCOUNTER — Telehealth: Payer: Self-pay | Admitting: Pulmonary Disease

## 2014-09-01 DIAGNOSIS — Z0000 Encounter for general adult medical examination without abnormal findings: Secondary | ICD-10-CM

## 2014-09-01 DIAGNOSIS — E7800 Pure hypercholesterolemia, unspecified: Secondary | ICD-10-CM

## 2014-09-01 NOTE — Telephone Encounter (Signed)
Pt has CPX scheduled with SN on Nov 9 and is requesting to come in tomorrow morning to have fasting labs drawn.  Pt reports he will be at office "as soon as the lab opens."  Reports this was supposed to be taken care of a few wks ago when appt was rescheduled.  Requesting call back once taken care of -- SN, pls advise.  Thank you.

## 2014-09-01 NOTE — Telephone Encounter (Signed)
Per SN-lets place order for FLP, BMET, Hep Panel, CBC diff, and TSH.  Pt is aware that labs has been placed in EPIC for tomorrow morning. Nothing more needed at this time.

## 2014-09-02 ENCOUNTER — Ambulatory Visit: Payer: 59 | Admitting: Pulmonary Disease

## 2014-09-02 ENCOUNTER — Other Ambulatory Visit (INDEPENDENT_AMBULATORY_CARE_PROVIDER_SITE_OTHER): Payer: 59

## 2014-09-02 DIAGNOSIS — E7800 Pure hypercholesterolemia, unspecified: Secondary | ICD-10-CM

## 2014-09-02 DIAGNOSIS — Z0000 Encounter for general adult medical examination without abnormal findings: Secondary | ICD-10-CM

## 2014-09-02 LAB — LIPID PANEL
CHOLESTEROL: 143 mg/dL (ref 0–200)
HDL: 47.3 mg/dL (ref >39.00)
LDL Cholesterol: 81 mg/dL (ref 0–99)
NonHDL: 95.7
Total CHOL/HDL Ratio: 3
Triglycerides: 76 mg/dL (ref 0.0–149.0)
VLDL: 15.2 mg/dL (ref 0.0–40.0)

## 2014-09-02 LAB — HEPATIC FUNCTION PANEL
ALBUMIN: 3.5 g/dL (ref 3.5–5.2)
ALK PHOS: 49 U/L (ref 39–117)
ALT: 32 U/L (ref 0–53)
AST: 20 U/L (ref 0–37)
Bilirubin, Direct: 0.1 mg/dL (ref 0.0–0.3)
Total Bilirubin: 1 mg/dL (ref 0.2–1.2)
Total Protein: 7 g/dL (ref 6.0–8.3)

## 2014-09-02 LAB — CBC WITH DIFFERENTIAL/PLATELET
BASOS ABS: 0 10*3/uL (ref 0.0–0.1)
Basophils Relative: 0.6 % (ref 0.0–3.0)
EOS PCT: 2.7 % (ref 0.0–5.0)
Eosinophils Absolute: 0.2 10*3/uL (ref 0.0–0.7)
HEMATOCRIT: 47.9 % (ref 39.0–52.0)
Hemoglobin: 16 g/dL (ref 13.0–17.0)
LYMPHS ABS: 1.6 10*3/uL (ref 0.7–4.0)
Lymphocytes Relative: 28.1 % (ref 12.0–46.0)
MCHC: 33.4 g/dL (ref 30.0–36.0)
MCV: 92.3 fl (ref 78.0–100.0)
Monocytes Absolute: 0.4 10*3/uL (ref 0.1–1.0)
Monocytes Relative: 6.7 % (ref 3.0–12.0)
NEUTROS PCT: 61.9 % (ref 43.0–77.0)
Neutro Abs: 3.6 10*3/uL (ref 1.4–7.7)
PLATELETS: 275 10*3/uL (ref 150.0–400.0)
RBC: 5.19 Mil/uL (ref 4.22–5.81)
RDW: 12.9 % (ref 11.5–15.5)
WBC: 5.8 10*3/uL (ref 4.0–10.5)

## 2014-09-02 LAB — BASIC METABOLIC PANEL
BUN: 15 mg/dL (ref 6–23)
CO2: 30 mEq/L (ref 19–32)
Calcium: 9.2 mg/dL (ref 8.4–10.5)
Chloride: 104 mEq/L (ref 96–112)
Creatinine, Ser: 1.1 mg/dL (ref 0.4–1.5)
GFR: 71.73 mL/min (ref >60.00)
GLUCOSE: 106 mg/dL — AB (ref 70–99)
POTASSIUM: 4.8 meq/L (ref 3.5–5.1)
Sodium: 141 mEq/L (ref 135–145)

## 2014-09-02 LAB — TSH: TSH: 0.83 u[IU]/mL (ref 0.35–4.50)

## 2014-09-08 ENCOUNTER — Other Ambulatory Visit (INDEPENDENT_AMBULATORY_CARE_PROVIDER_SITE_OTHER): Payer: 59

## 2014-09-08 ENCOUNTER — Encounter: Payer: Self-pay | Admitting: Pulmonary Disease

## 2014-09-08 ENCOUNTER — Ambulatory Visit: Payer: 59 | Admitting: *Deleted

## 2014-09-08 ENCOUNTER — Ambulatory Visit (INDEPENDENT_AMBULATORY_CARE_PROVIDER_SITE_OTHER): Payer: 59 | Admitting: Pulmonary Disease

## 2014-09-08 ENCOUNTER — Ambulatory Visit (INDEPENDENT_AMBULATORY_CARE_PROVIDER_SITE_OTHER)
Admission: RE | Admit: 2014-09-08 | Discharge: 2014-09-08 | Disposition: A | Discharge status: Home / Self Care | Payer: 59 | Source: Ambulatory Visit | Attending: Pulmonary Disease | Admitting: Pulmonary Disease

## 2014-09-08 VITALS — BP 110/78 | HR 54 | Temp 98.4°F | Ht 71.0 in | Wt 201.0 lb

## 2014-09-08 DIAGNOSIS — Z23 Encounter for immunization: Secondary | ICD-10-CM

## 2014-09-08 DIAGNOSIS — Z0000 Encounter for general adult medical examination without abnormal findings: Secondary | ICD-10-CM

## 2014-09-08 DIAGNOSIS — E7800 Pure hypercholesterolemia, unspecified: Secondary | ICD-10-CM

## 2014-09-08 DIAGNOSIS — G4733 Obstructive sleep apnea (adult) (pediatric): Secondary | ICD-10-CM

## 2014-09-08 DIAGNOSIS — R7301 Impaired fasting glucose: Secondary | ICD-10-CM

## 2014-09-08 LAB — URINALYSIS
Bilirubin Urine: NEGATIVE
Hgb urine dipstick: NEGATIVE
Ketones, ur: NEGATIVE
Leukocytes, UA: NEGATIVE
Nitrite: NEGATIVE
PH: 6.5 (ref 5.0–8.0)
Specific Gravity, Urine: 1.02 (ref 1.000–1.030)
TOTAL PROTEIN, URINE-UPE24: NEGATIVE
UROBILINOGEN UA: 0.2 (ref 0.0–1.0)
Urine Glucose: NEGATIVE

## 2014-09-08 LAB — PSA: PSA: 0.92 ng/mL (ref 0.10–4.00)

## 2014-09-08 MED ORDER — VITAMIN C 500 MG PO TABS: 1000.0000 mg | ORAL_TABLET | Freq: Every day | ORAL | Status: DC

## 2014-09-08 MED ORDER — VITAMIN D 1000 UNITS PO TABS: 1000.0000 [IU] | ORAL_TABLET | Freq: Every day | ORAL | Status: DC

## 2014-09-08 MED ORDER — FISH OIL 1000 MG PO CAPS: ORAL_CAPSULE | ORAL | Status: DC

## 2014-09-08 MED ORDER — GLUCOSAMINE 1500 COMPLEX PO CAPS: 2.0000 | ORAL_CAPSULE | Freq: Every day | ORAL | Status: DC

## 2014-09-08 MED ORDER — VITAMIN B-12 500 MCG PO TABS: 500.0000 ug | ORAL_TABLET | Freq: Every day | ORAL | Status: DC

## 2014-09-08 NOTE — Patient Instructions (Signed)
Today we updated your med list in our EPIC system...    Continue your current medications the same...  We reviewed your recent FASTING blood work & gave you a copy...  They inadvertently omitted the PSA blood test and Urinalysis... Today we did your follow up EKG & CXR...    We will contact you w/ the results when available...   Keep up the good work w/ diet & Yoga...  Call for any questions...  Let's plan a follow up visit in [id number], sooner if needed for problems.Marland Kitchen..Marland Kitchen

## 2014-09-08 NOTE — Progress Notes (Signed)
Subjective:    Patient ID: Daniel Carpenter, male    DOB: 10-Nov-1955, 58 y.o.   MRN: [id number]  HPI 58 y/o WM here for a follow up visit and CPX...  ~  SEE PREV EPIC NOTES FOR OLDER DATA >>   ~  August 29, 2012:  Yearly ROV & CPX> Daniel Carpenter has had another good yr- no new complaints or concerns;  He is no longer working for [employer] & has a Neurosurgeonnew engineering job & enjoying the challenge...  We reviewed the following medical problems during today's office visit>>     OSA> see below- stable on CPAP at 6cm pressure, uses regularly, rests well, no issues...    Pos FamHx CAD> both parents had CAD & CABG's, both died in their early 870's; he had neg Myoview 2004; he remains asymptomatic & we reviewed risk factor reduction strategy; he will also incr his exercise program...    Hyperlipid> on Simva40; FLP shows TChol 167, TG 104, HDL 46, LDL 101    BorderlineBS> on diet alone; Labs showed BS= 108 and he knows to avoid sweets etc...    He is up-to-date on colonoscopy screening> done 2010 by DrWood [place] & reported wnl- no polyps, divertics, etc...    Exam has noted atrophic testes> he is asymptomatic, feels well, good energy, libido ok, no performance issues, etc...     DJD> he has had some knee pain & [facility]; he reports improved...    Hx Actinic Keratoses> [facility]... We reviewed prob list, meds, xrays and labs> see below for updates >> ok Flu shot today...  CXR 10/13 showed normal heart size, clear lungs, WNL.Marland Kitchen.Marland Kitchen.  EKG 10/13 showed SBrady, rate 59, WNL, NAD...  LABS 10/13:  FLP- at goal on Simva40;  Chems- ok x BS=108;  CBC- wnl;  TSH=0.73;  PSA=1.13;  UA- clear...  ~  August 29, 2013:  Yearly ROV & CPX> Daniel Carpenter's only complaint is pain in his thumbs bilat, tender MCPs, and we discussed Mobic15, hot soaks, refer to Ortho is not responding... We reviewed the following medical problems during today's office visit >>     OSA> see below- stable on CPAP at 6cm pressure, uses regularly,  rests well, no issues; he is due for an upgrade & we will contact [facility]...    Pos FamHx CAD> both parents had CAD & CABG's, both died in their early 5470's; he had neg Myoview 2004; he remains asymptomatic & we reviewed risk factor reduction strategy; he will also incr his exercise program...    Hyperlipid> on Simva40; FLP 10/14 shows TChol 185, TG 115, HDL 52, LDL 110; Rec same med, better diet & exercise...    BorderlineBS> on diet alone; Labs 10/14 showed BS= 116 and he knows to avoid sweets etc...    He is up-to-date on colonoscopy screening> done 2010 by DrWood [place] & reported wnl- no polyps, divertics, etc...    Exam has noted atrophic testes> he is asymptomatic, feels well, good energy, libido ok, no performance issues, etc...     DJD> on Glucosamine; he has had some knee pain & [facility]; he reports improved; we wrote for Mobic15 to try for thumbs etc...    Hx Actinic Keratoses> [facility]... We reviewed prob list, meds, xrays and labs> see below for updates >> he takes several Vits & supplements... He does Yoga work-out 2d/wk...  LABS 10/14:  FLP- ok x LDL=110 on Simva40;  Chems- ok x BS=116;  CBC- wnl;  TSH=0.65;  PSA=0.95...  ~  April 21, 2014:  Add-on appt due to epis of blood in semen> he has seen this over 2 occasions in the last few weeks; denies blood in urine, no pain, no drainage, no f/c/s... He had a vasectomy 20+ yrs ago, no known sequellae... He is on ASA daily for primary prophylaxis... Prev exams have revealed atrophic testes> he is asymptomatic, feels well, good energy, libido ok, no performance issues, etc... Urinalysis is clear... Rec to HOLD his ASA & we will refer to Urology for reassurance & any further eval if needed...   ~  September 08, 2014:  Yearly CPX>  Daniel Carpenter reports doing well- no new complaints or concerns; prev hematospermia resolved spont w/o recurrence- he saw Urology, DrHerrick, who stressed that the prob is benign & rec considering finasteride  rx if needed for recurrent symptoms down the road... 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 We reviewed the following medical problems during today's office visit >>     OSA> see below- stable on CPAP at 6cm pressure, uses regularly, rests well, no issues; he has kept up w/ mask change and machine upgrades...     Pos FamHx CAD> both parents had CAD & CABG's, both died in their early 5170's; he had neg Myoview 2004; on ASA; he remains asymptomatic & we reviewed risk factor reduction strategy; he does Yoga w/ wife & it's a good workout.    Hyperlipid> on Simva40; FLP 11/15 shows TChol 143, TG 76, HDL 47, LDL 81; Rec same med, diet & exercise...    Impaired fasting gluc> on diet alone; Labs 11/15 showed BS= 106 and he knows to avoid sweets etc...    He is up-to-date on colonoscopy screening> done 2010 by DrWood [place] & reported wnl- no polyps, divertics, etc...    Exam has noted atrophic testes> he is asymptomatic, feels well, good energy, libido ok, no performance issues, etc...     Episode of hematospermia> this occurred 6/15 & resolved spont; he saw Urology DrHerrick who confirmed benign etiology & rec considering finasteride if it was to recur...    DJD> on Glucosamine; he has had some knee pain & [facility]; he reports improved; we wrote for Mobic15 to try for thumbs etc...    Hx Actinic Keratoses> [facility]... We reviewed prob list, meds, xrays and labs> see below for updates >>   CXR 11/15 showed norm heart size, clear lungs, NAD.Marland Kitchen.Marland Kitchen.  EKG 11/15 showed SBrady, rate46, rsr' in V1, otherw wnl...  LABS 11/15:  FLP- at goals on Simva40;  Chems- ok x BS=106;  CBC- wnl;  TSH=0.83;  PSA=0.92;  UA- clear, no blood or infection...            Problem List:  PHYSICAL EXAMINATION (ICD-V70.0) - advised ASA 81mg /d... he had a neg FlexSig in 1997 by DrStark and had full colonoscopy 2010 by DrWoods [place]> neg, normal w/o divertics or  polyps. ~  CXR 12/10 clear, WNL, post op right shoulder... ~  EKG 10/12 showed SBrady, rate 58, WNL.Marland Kitchen.. ~  CXR 10/13 showed normal heart size, clear lungs, WNL... ~  EKG 10/13 showed SBrady, rate 59, WNL, NAD.Marland Kitchen.. ~  CXR 11/15 showed norm heart size, clear lungs, NAD... ~  EKG 11/15 showed SBrady, rate46, rsr' in V1, otherw wnl...  OBSTRUCTIVE SLEEP APNEA (ICD-327.23) - Sleep Study 6/04 showed RDI= 10,  with desat to 81%... symptoms improved w/ CPAP trial and set at 6cm pressure... ENT eval by DrShoemaker... ~  10/12 & 10/13:  he reports using CPAP nightly, nasal pillows, no difficulty reported. ~  10/14: he continues to do well, reports that he is resting well, should be due for an upgrade & we will contact [facility]... ~  11/15: he continue stable, doing satis & no daytime sleepinesss; keeping up w/ mask changes etc...  Family Hx of CAD (ICD-414.00) - both father & mother died in their early 52's w/ heart disease and CABG's...  we discussed risk factor reduction and careful monitoring over time... ~  NuclearStressTest 10/04 was neg- no ischemia, no infarction, EF=61%... ~  10/12 - 10/14:  he remains asymptomatic- no CP, palpit, SOB, edema, etc... ~  11/15: he remains active doing vigorous yoga w/ his wife at classes in [place]...  HYPERLIPIDEMIA (ICD-272.4) - on SIMVASTATIN 40mg /d...  ~  FLP 8/08 showed TChol 212, TG 144, HDL 44, LDL 140... on diet alone- Rec= start Simva40. ~  FLP 7/09 (on Simva40) showed TChol 135, TG 79, HDL 42, LDL 78 ~  FLP 12/10 (on Simva40) showed TChol 173, TG 104, HDL 33, LDL 119 ~  FLP 10/11 showed TChol 131, TG 66, HDL 51, LDL 67 ~  FLP 10/12 on Simva40 showed TChol 152, TG 120, HDL 45, LDL 83 ~  FLP 10/13 on Simva40 showed TChol 167, TG 104, HDL 46, LDL 101 ~  FLP 10/14 on Simva40 showed TChol 185, TG 115, HDL 52, LDL 110... Rec- same med, better diet & exercise. ~  FLP 11/15 on Simva40 showed TChol 143, TG 76, HDL 47, LDL 81   DIABETES MELLITUS, BORDERLINE  (ICD-790.29) - +fam hx w/ 1 sis passed away w/ DM, ?hrt dis... ~  labs 8/08 w/ FBS= 121... advised low carb diet etc... he reports home BS= 100-110 range. ~  labs 7/09 showed BS= 116 ~  labs 12/10 showed BS= 107 ~  labs 10/11 showed BS= 107 ~  Labs 10/12 showed BS= 111 ~  Labs 10/13 on diet alone (wt=202#) showed BS= 108 ~  Labs 10/14 on diet alone (wt is down 8# to 194#) showed BS= 116 ~  Labs 11/15 on diet alone (wt=201#) showed BS= 106...   GI >> He is up-to-date on colonoscopy screening> done 2010 by DrWood [place] & reported wnl- no polyps, divertics, etc..   GU >> Hematospermia >> 2 episodes 6/15, otherw neg hx & exam, s/p vasectomy 20+yrs ago, urinalysis is neg, asked to HOLD ASA & we will refer to Urology... ~  He had a vasectomy >20 yrs ago... ~  He is also noted to have atrophic testes but is totally asymptomatic, w/o complaints...   DJD >> Notes some right knee problem & has seen Ortho in [place]; prev right rotator cuff surg 9/10 [place] as well... ~  10/14: c/o pain at MCPs of thumbs- rec for heat, Mobic15, refer to Ortho if not responding...  Hx of ACTINIC KERATOSIS (ICD-702.0) - he is followed by Derm at [facility]...  HEALTH MAINTENANCE: ~  GI:  He had Colonoscopy 2010 by DrWood in [place]> neg w/o polyps, divertics, etc; rec f/u 7-80yrs... ~  GU:  No LTOS, DRE is benign  ~  Immuniz:  He gets the yearly Flu vaccines...   Past Surgical History  Procedure Laterality Date  . Nasal septum surgery  1998  . Rotator cuff repair  2010    right  Outpatient Encounter Prescriptions as of 09/08/2014  Medication Sig  . aspirin 81 MG tablet Take 81 mg by mouth daily. HOLD  . cholecalciferol (VITAMIN D) 1000 UNITS tablet Take 1 tablet (1,000 Units total) by mouth daily.  . fish oil-omega-3 fatty acids 1000 MG capsule Take 2 capsules (2 g total) by mouth daily.  . Glucosamine-Chondroit-Vit C-Mn (GLUCOSAMINE 1500 COMPLEX) CAPS Take 2 capsules by mouth daily.  . simvastatin (ZOCOR) 40 MG tablet  Take 1 tablet by mouth  every night at bedtime  . vitamin B-12 (CYANOCOBALAMIN) 500 MCG tablet Take 1 tablet (500 mcg total) by mouth daily.  . vitamin C (ASCORBIC ACID) 500 MG tablet Take 2 tablets (1,000 mg total) by mouth daily.  . meloxicam (MOBIC) 15 MG tablet Take 1 tablet (15 mg total) by mouth daily as needed for pain.    No Known Allergies   Current Medications, Allergies, Past Medical History, Past Surgical History, Family History, and Social History were reviewed in [employer] record.    Review of Systems    The patient denies fever, chills, sweats, anorexia, fatigue, weakness, malaise, weight loss, sleep disorder, blurring, diplopia, eye irritation, eye discharge, vision loss, eye pain, photophobia, earache, ear discharge, tinnitus, decreased hearing, nasal congestion, nosebleeds, sore throat, hoarseness, chest pain, palpitations, syncope, dyspnea on exertion, orthopnea, PND, peripheral edema, cough, dyspnea at rest, excessive sputum, hemoptysis, wheezing, pleurisy, nausea, vomiting, diarrhea, constipation, change in bowel habits, abdominal pain, melena, hematochezia, jaundice, gas/bloating, indigestion/heartburn, dysphagia, odynophagia, dysuria, hematuria, urinary frequency, urinary hesitancy, nocturia, incontinence, back pain, joint pain, joint swelling, muscle cramps, muscle weakness, stiffness, arthritis, sciatica, restless legs, leg pain at night, leg pain with exertion, rash, itching, dryness, suspicious lesions, paralysis, paresthesias, seizures, tremors, vertigo, transient blindness, frequent falls, frequent headaches, difficulty walking, depression, anxiety, memory loss, confusion, cold intolerance, heat intolerance, polydipsia, polyphagia, polyuria, unusual weight change, abnormal bruising, bleeding, enlarged lymph nodes, urticaria, allergic rash, hay fever, and recurrent infections.     Objective:   Physical Exam     WD, WN, 58 y/o WM in  [place]... GENERAL:  Alert & oriented; pleasant & cooperative... HEENT:  [place]/AT, EOM-wnl, PERRLA, Fundi-benign, EACs-clear, TMs-wnl, NOSE-clear, THROAT-clear & wnl. NECK:  Supple w/ full ROM; no JVD; normal carotid impulses w/o bruits; no thyromegaly or nodules palpated; no lymphadenopathy. CHEST:  Clear to P & A; without wheezes/ rales/ or rhonchi. HEART:  Regular Rhythm; without murmurs/ rubs/ or gallops. ABDOMEN:  Soft & nontender; normal bowel sounds; no organomegaly or masses detected. RECTAL:  Neg - prostate 2+ & nontender w/o nodules; stool hematest neg. EXT: without deformities or arthritic changes; no varicose veins/ venous insuffic/ or edema. s/p right rotator cuff surg... NEURO:  CN's intact; motor testing normal; sensory testing normal; gait normal & balance OK. DERM:  No lesions noted; no rash etc >> followed by Derm at [facility]...  RADIOLOGY DATA:  Reviewed in the EPIC EMR & discussed w/ the patient...  LABORATORY DATA:  Reviewed in the EPIC EMR & discussed w/ the patient...   Assessment & Plan:    OSA>  Stable on CPAP w/o issues; continue same...  Fam Hx CAD>  Aware, pt remains asymptomatic, exercises regularly, doing well...  CHOL>  On Simva40 & FLP looks OK; continue same...  Impaired fasting gluc>  +FamHx & FBS=106, discussed diet exercise etc...  Hx Blood in semen>  This occurred 6/15, resolved spont, no recurrence... Hx sl atrophic testes but he denies Low-T symptoms and we have not pursued this diagnosis  in the past... Hx vasectomy 20+ yrs ago w/o sequelae...  DJD>  Notes some right knee problem & bilat thumb pain; has seen Ortho in [place]; try Mobic15; prev right rotator cuff surg 9[place]10 [place] well...  Other medical problems as listed...   Patient's Medications  New Prescriptions   OMEGA-3 FATTY ACIDS (FISH OIL) 1000 MG CAPS    Take 2 capsules by mouth daily  Previous Medications   ASPIRIN 81 MG TABLET    Take 81 mg by mouth daily. HOLD   FISH OIL-OMEGA-3 FATTY  ACIDS 1000 MG CAPSULE    Take 2 capsules (2 g total) by mouth daily.   SIMVASTATIN (ZOCOR) 40 MG TABLET    Take 1 tablet by mouth  every night at bedtime  Modified Medications   Modified Medication Previous Medication   CHOLECALCIFEROL (VITAMIN D) 1000 UNITS TABLET cholecalciferol (VITAMIN D) 1000 UNITS tablet      Take 1 tablet (1,000 Units total) by mouth daily.    Take 1 tablet (1,000 Units total) by mouth daily.   GLUCOSAMINE-CHONDROIT-VIT C-MN (GLUCOSAMINE 1500 COMPLEX) CAPS Glucosamine-Chondroit-Vit C-Mn (GLUCOSAMINE 1500 COMPLEX) CAPS      Take 2 capsules by mouth daily.    Take 2 capsules by mouth daily.   VITAMIN B-12 (CYANOCOBALAMIN) 500 MCG TABLET vitamin B-12 (CYANOCOBALAMIN) 500 MCG tablet      Take 1 tablet (500 mcg total) by mouth daily.    Take 1 tablet (500 mcg total) by mouth daily.   VITAMIN C (ASCORBIC ACID) 500 MG TABLET vitamin C (ASCORBIC ACID) 500 MG tablet      Take 2 tablets (1,000 mg total) by mouth daily.    Take 2 tablets (1,000 mg total) by mouth daily.  Discontinued Medications   MELOXICAM (MOBIC) 15 MG TABLET    Take 1 tablet (15 mg total) by mouth daily as needed for pain.

## 2014-09-10 ENCOUNTER — Encounter: Payer: Self-pay | Admitting: Pulmonary Disease

## 2014-12-05 ENCOUNTER — Other Ambulatory Visit: Payer: Self-pay | Admitting: Pulmonary Disease

## 2015-07-31 ENCOUNTER — Telehealth: Payer: Self-pay | Admitting: Pulmonary Disease

## 2015-07-31 MED ORDER — MELOXICAM 15 MG PO TABS: 15.0000 mg | ORAL_TABLET | Freq: Every day | ORAL | Status: DC | PRN

## 2015-07-31 NOTE — Telephone Encounter (Signed)
Spoke with pt He is requesting meloxicam refill  Rx sent in x 1 only pending ov with SN  Nothing further needed

## 2015-08-06 DIAGNOSIS — Z9889 Other specified postprocedural states: Secondary | ICD-10-CM | POA: Insufficient documentation

## 2015-09-09 ENCOUNTER — Encounter: Payer: Self-pay | Admitting: Pulmonary Disease

## 2015-09-09 ENCOUNTER — Other Ambulatory Visit (INDEPENDENT_AMBULATORY_CARE_PROVIDER_SITE_OTHER): Payer: 59

## 2015-09-09 ENCOUNTER — Ambulatory Visit (INDEPENDENT_AMBULATORY_CARE_PROVIDER_SITE_OTHER): Payer: 59 | Admitting: Pulmonary Disease

## 2015-09-09 VITALS — BP 122/70 | HR 52 | Temp 97.7°F | Wt 195.8 lb

## 2015-09-09 DIAGNOSIS — R7301 Impaired fasting glucose: Secondary | ICD-10-CM

## 2015-09-09 DIAGNOSIS — E7800 Pure hypercholesterolemia, unspecified: Secondary | ICD-10-CM

## 2015-09-09 DIAGNOSIS — Z23 Encounter for immunization: Secondary | ICD-10-CM | POA: Diagnosis not present

## 2015-09-09 DIAGNOSIS — Z0000 Encounter for general adult medical examination without abnormal findings: Secondary | ICD-10-CM

## 2015-09-09 DIAGNOSIS — G4733 Obstructive sleep apnea (adult) (pediatric): Secondary | ICD-10-CM

## 2015-09-09 LAB — CBC WITH DIFFERENTIAL/PLATELET
BASOS ABS: 0 10*3/uL (ref 0.0–0.1)
Basophils Relative: 0.4 % (ref 0.0–3.0)
EOS PCT: 2.5 % (ref 0.0–5.0)
Eosinophils Absolute: 0.1 10*3/uL (ref 0.0–0.7)
HCT: 48.1 % (ref 39.0–52.0)
Hemoglobin: 16 g/dL (ref 13.0–17.0)
LYMPHS ABS: 1.4 10*3/uL (ref 0.7–4.0)
Lymphocytes Relative: 28.8 % (ref 12.0–46.0)
MCHC: 33.2 g/dL (ref 30.0–36.0)
MCV: 92.5 fl (ref 78.0–100.0)
MONO ABS: 0.4 10*3/uL (ref 0.1–1.0)
MONOS PCT: 8.1 % (ref 3.0–12.0)
NEUTROS ABS: 2.9 10*3/uL (ref 1.4–7.7)
NEUTROS PCT: 60.2 % (ref 43.0–77.0)
PLATELETS: 261 10*3/uL (ref 150.0–400.0)
RBC: 5.2 Mil/uL (ref 4.22–5.81)
RDW: 12.8 % (ref 11.5–15.5)
WBC: 4.9 10*3/uL (ref 4.0–10.5)

## 2015-09-09 LAB — PSA: PSA: 1.15 ng/mL (ref 0.10–4.00)

## 2015-09-09 LAB — TSH: TSH: 0.51 u[IU]/mL (ref 0.35–4.50)

## 2015-09-09 LAB — LIPID PANEL
CHOLESTEROL: 182 mg/dL (ref 0–200)
HDL: 49.9 mg/dL (ref >39.00)
LDL CALC: 118 mg/dL — AB (ref 0–99)
NonHDL: 132.08
TRIGLYCERIDES: 69 mg/dL (ref 0.0–149.0)
Total CHOL/HDL Ratio: 4
VLDL: 13.8 mg/dL (ref 0.0–40.0)

## 2015-09-09 LAB — BASIC METABOLIC PANEL
BUN: 15 mg/dL (ref 6–23)
CALCIUM: 9.4 mg/dL (ref 8.4–10.5)
CO2: 29 meq/L (ref 19–32)
Chloride: 104 mEq/L (ref 96–112)
Creatinine, Ser: 0.96 mg/dL (ref 0.40–1.50)
GFR: 85.39 mL/min (ref >60.00)
GLUCOSE: 111 mg/dL — AB (ref 70–99)
Potassium: 4.6 mEq/L (ref 3.5–5.1)
SODIUM: 141 meq/L (ref 135–145)

## 2015-09-09 LAB — HEPATIC FUNCTION PANEL
ALBUMIN: 4.2 g/dL (ref 3.5–5.2)
ALK PHOS: 54 U/L (ref 39–117)
ALT: 24 U/L (ref 0–53)
AST: 18 U/L (ref 0–37)
Bilirubin, Direct: 0.1 mg/dL (ref 0.0–0.3)
TOTAL PROTEIN: 6.7 g/dL (ref 6.0–8.3)
Total Bilirubin: 0.7 mg/dL (ref 0.2–1.2)

## 2015-09-09 NOTE — Patient Instructions (Signed)
Today we updated your med list in our EPIC system...    Continue your current medications the same...  We refilled your meds per request...  Today we did your f/u FASTING lab work...    We will contact you w/ the results when available...   Keep up the good job w/ diet & exercise...  Call for any questions...  Let's plan a follow up visit in [id number], sooner if needed for problems.Marland Kitchen..Marland Kitchen

## 2016-01-24 ENCOUNTER — Other Ambulatory Visit: Payer: Self-pay | Admitting: Pulmonary Disease

## 2016-01-25 NOTE — Telephone Encounter (Signed)
Needs OV: 09/08/16 Patient requesting refill on Mobic Last refilled: 07/31/15  Current Outpatient Prescriptions on File Prior to Visit  Medication Sig Dispense Refill  . aspirin 81 MG tablet Take 81 mg by mouth daily. HOLD    . cholecalciferol (VITAMIN D) 1000 UNITS tablet Take 1 tablet (1,000 Units total) by mouth daily. 90 tablet 3  . fish oil-omega-3 fatty acids 1000 MG capsule Take 2 capsules (2 g total) by mouth daily. 90 capsule 3  . Glucosamine-Chondroit-Vit C-Mn (GLUCOSAMINE 1500 COMPLEX) CAPS Take 2 capsules by mouth daily. 180 capsule 3  . meloxicam (MOBIC) 15 MG tablet Take 1 tablet (15 mg total) by mouth daily as needed for pain. 30 tablet 0  . Omega-3 Fatty Acids (FISH OIL) 1000 MG CAPS Take 2 capsules by mouth daily 180 capsule 3  . simvastatin (ZOCOR) 40 MG tablet Take 1 tablet by mouth  every night at bedtime 90 tablet 2  . vitamin B-12 (CYANOCOBALAMIN) 500 MCG tablet Take 1 tablet (500 mcg total) by mouth daily. 90 tablet 3  . vitamin C (ASCORBIC ACID) 500 MG tablet Take 2 tablets (1,000 mg total) by mouth daily. 90 tablet 3   No current facility-administered medications on file prior to visit.   No Known Allergies

## 2016-01-25 NOTE — Telephone Encounter (Signed)
Per SN >> okay refill Mobic.

## 2016-03-21 ENCOUNTER — Other Ambulatory Visit: Payer: Self-pay | Admitting: Pulmonary Disease

## 2016-09-08 ENCOUNTER — Ambulatory Visit (INDEPENDENT_AMBULATORY_CARE_PROVIDER_SITE_OTHER)
Admission: RE | Admit: 2016-09-08 | Discharge: 2016-09-08 | Disposition: A | Discharge status: Home / Self Care | Payer: 59 | Source: Ambulatory Visit | Attending: Pulmonary Disease | Admitting: Pulmonary Disease

## 2016-09-08 ENCOUNTER — Other Ambulatory Visit (INDEPENDENT_AMBULATORY_CARE_PROVIDER_SITE_OTHER): Payer: 59

## 2016-09-08 ENCOUNTER — Ambulatory Visit (INDEPENDENT_AMBULATORY_CARE_PROVIDER_SITE_OTHER): Payer: 59 | Admitting: Pulmonary Disease

## 2016-09-08 VITALS — BP 120/70 | HR 50 | Temp 97.2°F | Ht 70.5 in | Wt 198.1 lb

## 2016-09-08 DIAGNOSIS — Z0000 Encounter for general adult medical examination without abnormal findings: Secondary | ICD-10-CM

## 2016-09-08 DIAGNOSIS — M170 Bilateral primary osteoarthritis of knee: Secondary | ICD-10-CM

## 2016-09-08 DIAGNOSIS — R7301 Impaired fasting glucose: Secondary | ICD-10-CM

## 2016-09-08 DIAGNOSIS — G4733 Obstructive sleep apnea (adult) (pediatric): Secondary | ICD-10-CM

## 2016-09-08 DIAGNOSIS — E7800 Pure hypercholesterolemia, unspecified: Secondary | ICD-10-CM | POA: Diagnosis not present

## 2016-09-08 DIAGNOSIS — Z23 Encounter for immunization: Secondary | ICD-10-CM

## 2016-09-08 LAB — CBC WITH DIFFERENTIAL/PLATELET
Basophils Absolute: 0 10*3/uL (ref 0.0–0.1)
Basophils Relative: 0.5 % (ref 0.0–3.0)
EOS ABS: 0.2 10*3/uL (ref 0.0–0.7)
EOS PCT: 2.7 % (ref 0.0–5.0)
HCT: 46.3 % (ref 39.0–52.0)
HEMOGLOBIN: 15.8 g/dL (ref 13.0–17.0)
Lymphocytes Relative: 30.1 % (ref 12.0–46.0)
Lymphs Abs: 1.7 10*3/uL (ref 0.7–4.0)
MCHC: 34.2 g/dL (ref 30.0–36.0)
MCV: 90.9 fl (ref 78.0–100.0)
MONO ABS: 0.4 10*3/uL (ref 0.1–1.0)
Monocytes Relative: 7.7 % (ref 3.0–12.0)
Neutro Abs: 3.4 10*3/uL (ref 1.4–7.7)
Neutrophils Relative %: 59 % (ref 43.0–77.0)
Platelets: 254 10*3/uL (ref 150.0–400.0)
RBC: 5.09 Mil/uL (ref 4.22–5.81)
RDW: 12.7 % (ref 11.5–15.5)
WBC: 5.8 10*3/uL (ref 4.0–10.5)

## 2016-09-08 LAB — LIPID PANEL
CHOL/HDL RATIO: 3
CHOLESTEROL: 158 mg/dL (ref 0–200)
HDL: 54.2 mg/dL (ref >39.00)
LDL CALC: 80 mg/dL (ref 0–99)
NonHDL: 103.83
Triglycerides: 120 mg/dL (ref 0.0–149.0)
VLDL: 24 mg/dL (ref 0.0–40.0)

## 2016-09-08 LAB — COMPREHENSIVE METABOLIC PANEL
ALK PHOS: 53 U/L (ref 39–117)
ALT: 25 U/L (ref 0–53)
AST: 17 U/L (ref 0–37)
Albumin: 4.3 g/dL (ref 3.5–5.2)
BUN: 15 mg/dL (ref 6–23)
CO2: 31 mEq/L (ref 19–32)
Calcium: 9.5 mg/dL (ref 8.4–10.5)
Chloride: 104 mEq/L (ref 96–112)
Creatinine, Ser: 0.95 mg/dL (ref 0.40–1.50)
GFR: 86.13 mL/min (ref >60.00)
GLUCOSE: 108 mg/dL — AB (ref 70–99)
POTASSIUM: 4.4 meq/L (ref 3.5–5.1)
SODIUM: 141 meq/L (ref 135–145)
TOTAL PROTEIN: 6.7 g/dL (ref 6.0–8.3)
Total Bilirubin: 0.6 mg/dL (ref 0.2–1.2)

## 2016-09-08 LAB — URINALYSIS
BILIRUBIN URINE: NEGATIVE
HGB URINE DIPSTICK: NEGATIVE
Ketones, ur: NEGATIVE
LEUKOCYTES UA: NEGATIVE
NITRITE: NEGATIVE
Specific Gravity, Urine: 1.01 (ref 1.000–1.030)
Total Protein, Urine: NEGATIVE
UROBILINOGEN UA: 0.2 (ref 0.0–1.0)
Urine Glucose: NEGATIVE
pH: 6 (ref 5.0–8.0)

## 2016-09-08 LAB — TSH: TSH: 0.9 u[IU]/mL (ref 0.35–4.50)

## 2016-09-08 LAB — PSA: PSA: 1.26 ng/mL (ref 0.10–4.00)

## 2016-09-08 MED ORDER — SIMVASTATIN 40 MG PO TABS: 40.0000 mg | ORAL_TABLET | Freq: Every day | ORAL | 3 refills | Status: DC

## 2016-09-08 NOTE — Patient Instructions (Signed)
Today we updated your med list in our EPIC system...    Continue your current medications the same...    We refilled your SIMVASTATIN...  Today we checked your follow up CXR, EKG, & FASTING blood work...    We will contact you w/ the results when available...   Keep up the good work w/ diet, exercise (yoga), etc...  Call for any questions...  Let's plan a follow up visit in [phone], sooner if needed for problems.Marland Kitchen..Marland Kitchen

## 2016-09-08 NOTE — Progress Notes (Signed)
Subjective:    Patient ID: Daniel Carpenter, male    DOB: 23-May-1956, 60 y.o.   MRN: [id number]  HPI 60 y/o WM here for a follow up visit and CPX...  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  We reviewed the following medical problems during today's office visit>>     OSA> see below- stable on CPAP at 6cm pressure, uses regularly, rests well, no issues...    Pos FamHx CAD> both parents had CAD & CABG's, both died in their early 59's; he had neg Myoview 2004; he remains asymptomatic & we reviewed risk factor reduction strategy; he will also incr his exercise program...    Hyperlipid> on Simva40; FLP shows TChol 167, TG 104, HDL 46, LDL 101    BorderlineBS> on diet alone; Labs showed BS= 108 and he knows to avoid sweets etc...    He is up-to-date on colonoscopy screening> done 2010 by DrWood [place] & reported wnl- no polyps, divertics, etc...    Exam has noted atrophic testes> he is asymptomatic, feels well, good energy, libido ok, no performance issues, etc...     DJD> he has had some knee pain & saw an Ortho at [facility]; he reports improved...    Hx Actinic Keratoses> followed at [facility]... We reviewed prob list, meds, xrays and labs> see below for updates >> ok Flu shot today...  CXR 10/13 showed normal heart size, clear lungs, WNL.Marland KitchenMarland Kitchen  EKG 10/13 showed SBrady, rate 59, WNL, NAD...  LABS 10/13:  FLP- at goal on Simva40;  Chems- ok x BS=108;  CBC- wnl;  TSH=0.73;  PSA=1.13;  UA- clear...  ~  August 29, 2013:  Yearly ROV & CPX> Daniel Carpenter's only complaint is pain in his thumbs bilat, tender MCPs, and we discussed Mobic15, hot soaks, refer to Ortho is not responding... We reviewed the following medical problems during today's office visit >>     OSA> see below- stable on CPAP at 6cm pressure, uses regularly,  rests well, no issues; he is due for an upgrade & we will contact [facility]...    Pos FamHx CAD> both parents had CAD & CABG's, both died in their early 15's; he had neg Myoview 2004; he remains asymptomatic & we reviewed risk factor reduction strategy; he will also incr his exercise program...    Hyperlipid> on Simva40; FLP 10/14 shows TChol 185, TG 115, HDL 52, LDL 110; Rec same med, better diet & exercise...    BorderlineBS> on diet alone; Labs 10/14 showed BS= 116 and he knows to avoid sweets etc...    He is up-to-date on colonoscopy screening> done 2010 by DrWood [place] & reported wnl- no polyps, divertics, etc...    Exam has noted atrophic testes> he is asymptomatic, feels well, good energy, libido ok, no performance issues, etc...     DJD> on Glucosamine; he has had some knee pain & saw an Ortho at [facility]; he reports improved; we wrote for Mobic15 to try for thumbs etc...    Hx Actinic Keratoses> followed at [facility]... We reviewed prob list, meds, xrays and labs> see below for updates >> he takes several Vits & supplements... He does Yoga work-out 2d/wk...  LABS 10/14:  FLP- ok x LDL=110 on Simva40;  Chems- ok x BS=116;  CBC- wnl;  TSH=0.65;  PSA=0.95...  ~  April 21, 2014:  Add-on appt due to epis of blood in semen> he has seen this over 2 occasions in the last few weeks; denies blood in urine, no pain, no drainage, no f/c/s... He had a vasectomy 20+ yrs ago, no known sequellae... He is on ASA daily for primary prophylaxis... Prev exams have revealed atrophic testes> he is asymptomatic, feels well, good energy, libido ok, no performance issues, etc... Urinalysis is clear... Rec to HOLD his ASA & we will refer to Urology for reassurance & any further eval if needed...   ~  September 08, 2014:  Yearly CPX>  Daniel Carpenter reports doing well- no new complaints or concerns; prev hematospermia resolved spont w/o recurrence- he saw Urology, DrHerrick, who stressed that the prob is benign & rec considering finasteride  rx if needed for recurrent symptoms down the road... 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 We reviewed the following medical problems during today's office visit >>     OSA> see below- stable on CPAP at 6cm pressure, uses regularly, rests well, no issues; he has kept up w/ mask change and machine upgrades...     Pos FamHx CAD> both parents had CAD & CABG's, both died in their early 3370's; he had neg Myoview 2004; on ASA; he remains asymptomatic & we reviewed risk factor reduction strategy; he does Yoga w/ wife & it's a good workout.    Hyperlipid> on Simva40; FLP 11/15 shows TChol 143, TG 76, HDL 47, LDL 81; Rec same med, diet & exercise...    Impaired fasting gluc> on diet alone; Labs 11/15 showed BS= 106 and he knows to avoid sweets etc...    He is up-to-date on colonoscopy screening> done 2010 [place] DrWood [place] & reported wnl- no polyps, divertics, etc...    Exam has noted atrophic testes> he is asymptomatic, feels well, good energy, libido ok, no performance issues, etc...     Episode of hematospermia> this occurred 6/15 & resolved spont; he saw Urology DrHerrick who confirmed benign etiology & rec considering finasteride if it was to recur...    DJD> on Glucosamine; he has had some knee pain & [facility]; he reports improved; we wrote for Mobic15 to try for thumbs etc...    Hx Actinic Keratoses> [facility]... We reviewed prob list, meds, xrays and labs> see below for updates >>   CXR 11/15 showed norm heart size, clear lungs, NAD.Marland Kitchen.Marland Kitchen.  EKG 11/15 showed SBrady, rate46, rsr' in V1, otherw wnl...  LABS 11/15:  FLP- at goals on Simva40;  Chems- ok x BS=106;  CBC- wnl;  TSH=0.83;  PSA=0.92;  UA- clear, no blood or infection...   ~  September 09, 2015:  Yearly ROV & CPX>  Daniel Carpenter reports a good yr overall- he has bilat knee DJD & [facility] ("he trimmed the meniscus") and imroved since  then; he has no other complaints or concerns... we reviewed the following medical problems during today's office visit >>     OSA> see below- stable on CPAP at 6cm pressure, uses regularly, rests well, no issues; he has kept up w/ mask change and machine upgrades...     Pos FamHx CAD> both parents had CAD & CABG's, both died in their early 7270's; he had neg Myoview 2004; on ASA; he remains asymptomatic & we  reviewed risk factor reduction strategy; he does Yoga w/ wife & it's a good workout- no CP/ palpit/ dizzy/ SOB/ edema...    Hyperlipid> on Simva40; FLP 11/16 shows TChol 182, TG 69, HDL 50, LDL 118; Rec- same med, better diet & resume ercise...    Impaired fasting gluc> on diet alone; Labs 11/16 showed BS= 111 and he knows to avoid sweets etc...    He is up-to-date on colonoscopy screening> done 2010 by DrWood in [place] & reported wnl- no polyps, divertics, & f/u planned 10 yrs...    Exam has noted atrophic testes> he is asymptomatic, feels well, good energy, libido ok, no performance issues, etc...     Episode of hematospermia> this occurred 6/15 & resolved spont; he saw Urology DrHerrick who confirmed benign etiology & rec considering Finasteride if it was to recur...    DJD- known bilat knee arthritis, s/p right knee arthroscopy 9/16 by DrMartin [facility]> on Glucosamine & Mobic15; he has bilat knee pain (R>L) & [facility]- s/p Right arthroscopy 9/16 & he reports improved...     Hx Actinic Keratoses> [facility]... EXAM shows Afeb, VSS, O2sat=99% on RA;  HEENT- neg, mallamapti2;  Chest- clear w/o w/r/r;  Heart- RR w/o m/r/g;  Abd- soft, nontender, neg;  Ext- neg w/o c/c/e;  Neuro- intact...  LABS 09/09/15>  FLP- on Simva40 but LDL=118;  Chems- ok x FBS=111;  CBC- wnl;  TSH=0.51;  PSA=1.15... IMP/PLAN>>  Yann is stable- s/p right knee arthroscopy & getting back into his exerc program; his FLP & FBS are not quite at goals and we reviewed diet & exercise prescription; Rec to continue  same meds...            Problem List:  PHYSICAL EXAMINATION (ICD-V70.0) - advised ASA 81mg /d... he had a neg FlexSig in 1997 by DrStark and had full colonoscopy 2010 by DrWoods in [place]> neg, normal w/o divertics or polyps. ~  CXR 12/10 clear, WNL, post op right shoulder... ~  EKG 10/12 showed SBrady, rate 58, WNL.Marland Kitchen.. ~  CXR 10/13 showed normal heart size, clear lungs, WNL... ~  EKG 10/13 showed SBrady, rate 59, WNL, NAD.Marland Kitchen.. ~  CXR 11/15 showed norm heart size, clear lungs, NAD... ~  EKG 11/15 showed SBrady, rate46, rsr' in V1, otherw wnl...  OBSTRUCTIVE SLEEP APNEA (ICD-327.23) - Sleep Study 6/04 showed RDI= 10, with desat to 81%... symptoms improved w/ CPAP trial and set at 6cm pressure... ENT eval by DrShoemaker... ~  10/12 & 10/13:  he reports using CPAP nightly, nasal pillows, no difficulty reported. ~  10/14: he continues to do well, reports that he is resting well, should be due for an upgrade & we will contact [facility]... ~  11/15: he continue stable, doing satis & no daytime sleepinesss; keeping up w/ mask changes etc... ~  11/16: he remains stable & compliant w/ CPAP therapy...  Family Hx of CAD (ICD-414.00) - both father & mother died in their early 1270's w/ heart disease and CABG's...  we discussed risk factor reduction and careful monitoring over time... ~  NuclearStressTest 10/04 was neg- no ischemia, no infarction, EF=61%... ~  10/12 - 10/14:  he remains asymptomatic- no CP, palpit, SOB, edema, etc... ~  11/15: he remains active doing vigorous yoga w/ his wife at classes in [place]... ~  11/16: [facility] & is starting to incr his exercise once again...  HYPERLIPIDEMIA (ICD-272.4) - on SIMVASTATIN 40mg /d...  ~  FLP 8/08 showed TChol 212, TG 144, HDL 44, LDL 140... on diet alone- Rec= start Simva40. ~  FLP 7/09 (on Simva40) showed TChol 135, TG 79, HDL 42, LDL 78 ~  FLP 12/10 (on Simva40) showed TChol 173, TG 104, HDL 33, LDL 119 ~  FLP 10/11  showed TChol 131, TG 66, HDL 51, LDL 67 ~  FLP 10/12 on Simva40 showed TChol 152, TG 120, HDL 45, LDL 83 ~  FLP 10/13 on Simva40 showed TChol 167, TG 104, HDL 46, LDL 101 ~  FLP 10/14 on Simva40 showed TChol 185, TG 115, HDL 52, LDL 110... Rec- same med, better diet & exercise. ~  FLP 11/15 on Simva40 showed TChol 143, TG 76, HDL 47, LDL 81  ~  FLP 11/16 on Simva40 showed TChol 182, TG 69, HDL 50, LDL 118... We reviewed diet & exercise program.  DIABETES MELLITUS, BORDERLINE (ICD-790.29) - +fam hx w/ 1 sis passed away w/ DM, ?hrt dis... ~  labs 8/08 w/ FBS= 121... advised low carb diet etc... he reports home BS= 100-110 range. ~  labs 7/09 showed BS= 116 ~  labs 12/10 showed BS= 107 ~  labs 10/11 showed BS= 107 ~  Labs 10/12 showed BS= 111 ~  Labs 10/13 on diet alone (wt=202#) showed BS= 108 ~  Labs 10/14 on diet alone (wt is down 8# to 194#) showed BS= 116 ~  Labs 11/15 on diet alone (wt=201#) showed BS= 106...  ~  Labs 11/16 on diet alone (wt=196#) showed BS= 111  GI >> He is up-to-date on colonoscopy screening> done [place] DrWood [place] & reported wnl- no polyps, divertics, etc..   GU >> Hematospermia >> 2 episodes 6/15, otherw neg hx & exam, s/p vasectomy 20+yrs ago, urinalysis is neg, asked to HOLD ASA & we will refer to Urology... ~  He had a vasectomy >20 yrs ago... ~  He is also noted to have atrophic testes but is totally asymptomatic, w/o complaints...   DJD >> Notes some right knee problem & has seen Ortho in [place]; prev right rotator cuff surg 9/10 [place] as well... ~  10/14: c/o pain at MCPs of thumbs- rec for heat, Mobic15, refer to Ortho if not responding... 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  Hx of ACTINIC KERATOSIS (ICD-702.0) - he is followed by Derm at [facility]...  HEALTH MAINTENANCE: ~  GI:  He had Colonoscopy 2010 by DrWood  [place]> neg w/o polyps, divertics, etc; rec f/u 7-23yrs... ~  GU:  No LTOS, DRE is benign  ~  Immuniz:  He gets the yearly Flu vaccines...   Past Surgical History:  Procedure Laterality Date  . NASAL SEPTUM SURGERY  1998  . ROTATOR CUFF REPAIR  2010   right    Outpatient Encounter Prescriptions as of 09/09/2015  Medication Sig Dispense Refill  . aspirin 81 MG tablet Take 81 mg by mouth daily. HOLD    . cholecalciferol (VITAMIN D) 1000 UNITS tablet Take 1 tablet (1,000 Units total) by mouth daily. 90 tablet 3  . fish oil-omega-3 fatty acids 1000 MG capsule Take 2 capsules (2 g total) by mouth daily. 90 capsule 3  . Glucosamine-Chondroit-Vit C-Mn (GLUCOSAMINE 1500 COMPLEX) CAPS Take 2 capsules by mouth daily. 180 capsule 3  . Omega-3 Fatty Acids (FISH OIL) 1000 MG CAPS Take  2 capsules by mouth daily 180 capsule 3  . vitamin B-12 (CYANOCOBALAMIN) 500 MCG tablet Take 1 tablet (500 mcg total) by mouth daily. 90 tablet 3  . vitamin C (ASCORBIC ACID) 500 MG tablet Take 2 tablets (1,000 mg total) by mouth daily. 90 tablet 3  . [DISCONTINUED] meloxicam (MOBIC) 15 MG tablet Take 1 tablet (15 mg total) by mouth daily as needed for pain. 30 tablet 0  . [DISCONTINUED] simvastatin (ZOCOR) 40 MG tablet Take 1 tablet by mouth  every night at bedtime 90 tablet 2   No facility-administered encounter medications on file as of 09/09/2015.     No Known Allergies   Current Medications, Allergies, Past Medical History, Past Surgical History, Family History, and Social History were reviewed in [facility] electronic medical record.    Review of Systems    The patient denies fever, chills, sweats, anorexia, fatigue, weakness, malaise, weight loss, sleep disorder, blurring, diplopia, eye irritation, eye discharge, vision loss, eye pain, photophobia, earache, ear discharge, tinnitus, decreased hearing, nasal congestion, nosebleeds, sore throat, hoarseness, chest pain, palpitations, syncope, dyspnea on  exertion, orthopnea, PND, peripheral edema, cough, dyspnea at rest, excessive sputum, hemoptysis, wheezing, pleurisy, nausea, vomiting, diarrhea, constipation, change in bowel habits, abdominal pain, melena, hematochezia, jaundice, gas/bloating, indigestion/heartburn, dysphagia, odynophagia, dysuria, hematuria, urinary frequency, urinary hesitancy, nocturia, incontinence, back pain, joint pain, joint swelling, muscle cramps, muscle weakness, stiffness, arthritis, sciatica, restless legs, leg pain at night, leg pain with exertion, rash, itching, dryness, suspicious lesions, paralysis, paresthesias, seizures, tremors, vertigo, transient blindness, frequent falls, frequent headaches, difficulty walking, depression, anxiety, memory loss, confusion, cold intolerance, heat intolerance, polydipsia, polyphagia, polyuria, unusual weight change, abnormal bruising, bleeding, enlarged lymph nodes, urticaria, allergic rash, hay fever, and recurrent infections.     Objective:   Physical Exam     WD, WN, 60 y/o WM in NAD... GENERAL:  Alert & oriented; pleasant & cooperative... HEENT:  [place]/AT, EOM-wnl, PERRLA, Fundi-benign, EACs-clear, TMs-wnl, NOSE-clear, THROAT-clear & wnl. NECK:  Supple w/ full ROM; no JVD; normal carotid impulses w/o bruits; no thyromegaly or nodules palpated; no lymphadenopathy. CHEST:  Clear to P & A; without wheezes/ rales/ or rhonchi. HEART:  Regular Rhythm; without murmurs/ rubs/ or gallops. ABDOMEN:  Soft & nontender; normal bowel sounds; no organomegaly or masses detected. RECTAL:  Neg - prostate 2+ & nontender w/o nodules; stool hematest neg. EXT: without deformities +arthritic changes R>L knee; no varicose veins/ venous insuffic/ or edema. s/p right rotator cuff surg... NEURO:  CN's intact; motor testing normal; sensory testing normal; gait normal & balance OK. DERM:  No lesions noted; no rash etc >> [facility]...  RADIOLOGY DATA:  Reviewed in the EPIC EMR & discussed w/  the patient...  LABORATORY DATA:  Reviewed in the EPIC EMR & discussed w/ the patient...   Assessment & Plan:    OSA>  Stable on CPAP w/o issues; continue same...  Fam Hx CAD>  Aware, pt remains asymptomatic, exercises regularly, doing well...  CHOL>  On Simva40 & FLP looks OK; continue same...  Impaired fasting gluc>  +FamHx & FBS=106, discussed diet exercise etc...  Hx Blood in semen>  This occurred 6/15, resolved spont, no recurrence... Hx sl atrophic testes but he denies Low-T symptoms and we have not pursued this diagnosis in the past... Hx vasectomy 20+ yrs ago w/o sequelae...  DJD>  S/p right rotator cuff surg 9/10 [place], and bilat knee pain w/ XRays and MRI confirming bilat knee arthritis R>L & meniscus  tear + chondromalacia patella=> he had right knee athroscopy 9/16 by DrMartin.  Other medical problems as listed...   Patient's Medications  New Prescriptions   No medications on file  Previous Medications   ASPIRIN 81 MG TABLET    Take 81 mg by mouth daily. HOLD   CHOLECALCIFEROL (VITAMIN D) 1000 UNITS TABLET    Take 1 tablet (1,000 Units total) by mouth daily.   FISH OIL-OMEGA-3 FATTY ACIDS 1000 MG CAPSULE    Take 2 capsules (2 g total) by mouth daily.   GLUCOSAMINE-CHONDROIT-VIT C-MN (GLUCOSAMINE 1500 COMPLEX) CAPS    Take 2 capsules by mouth daily.   OMEGA-3 FATTY ACIDS (FISH OIL) 1000 MG CAPS    Take 2 capsules by mouth daily   VITAMIN B-12 (CYANOCOBALAMIN) 500 MCG TABLET    Take 1 tablet (500 mcg total) by mouth daily.   VITAMIN C (ASCORBIC ACID) 500 MG TABLET    Take 2 tablets (1,000 mg total) by mouth daily.  Modified Medications   Modified Medication Previous Medication   MELOXICAM (MOBIC) 15 MG TABLET meloxicam (MOBIC) 15 MG tablet      TAKE 1 TABLET(15 MG) BY MOUTH DAILY AS NEEDED FOR PAIN    Take 1 tablet (15 mg total) by mouth daily as needed for pain.   SIMVASTATIN (ZOCOR) 40 MG TABLET simvastatin (ZOCOR) 40 MG tablet      Take 1 tablet by mouth  every  night at bedtime    Take 1 tablet by mouth  every night at bedtime  Discontinued Medications   No medications on file

## 2016-09-09 ENCOUNTER — Encounter: Payer: Self-pay | Admitting: Pulmonary Disease

## 2016-09-09 NOTE — Progress Notes (Signed)
Subjective:    Patient ID: Daniel Carpenter, male    DOB: 14-Aug-1956, 60 y.o.   MRN: [id number]  HPI 60 y/o WM here for a follow up visit and CPX...  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  We reviewed the following medical problems during today's office visit>>     OSA> see below- stable on CPAP at 6cm pressure, uses regularly, rests well, no issues...    Pos FamHx CAD> both parents had CAD & CABG's, both died in their early 92's; he had neg Myoview 2004; he remains asymptomatic & we reviewed risk factor reduction strategy; he will also incr his exercise program...    Hyperlipid> on Simva40; FLP shows TChol 167, TG 104, HDL 46, LDL 101    BorderlineBS> on diet alone; Labs showed BS= 108 and he knows to avoid sweets etc...    He is up-to-date on colonoscopy screening> done 2010 by DrWood [place] & reported wnl- no polyps, divertics, etc...    Exam has noted atrophic testes> he is asymptomatic, feels well, good energy, libido ok, no performance issues, etc...     DJD> he has had some knee pain & saw an Ortho at [facility]; he reports improved...    Hx Actinic Keratoses> followed at [facility]... We reviewed prob list, meds, xrays and labs> see below for updates >> ok Flu shot today...  CXR 10/13 showed normal heart size, clear lungs, WNL.Marland KitchenMarland Kitchen  EKG 10/13 showed SBrady, rate 59, WNL, NAD...  LABS 10/13:  FLP- at goal on Simva40;  Chems- ok x BS=108;  CBC- wnl;  TSH=0.73;  PSA=1.13;  UA- clear...  ~  August 29, 2013:  Yearly ROV & CPX> Daniel Carpenter's only complaint is pain in his thumbs bilat, tender MCPs, and we discussed Mobic15, hot soaks, refer to Ortho is not responding... We reviewed the following medical problems during today's office visit >>     OSA> see below- stable on CPAP at 6cm pressure, uses regularly,  rests well, no issues; he is due for an upgrade & we will contact [facility]...    Pos FamHx CAD> both parents had CAD & CABG's, both died in their early 30's; he had neg Myoview 2004; he remains asymptomatic & we reviewed risk factor reduction strategy; he will also incr his exercise program...    Hyperlipid> on Simva40; FLP 10/14 shows TChol 185, TG 115, HDL 52, LDL 110; Rec same med, better diet & exercise...    BorderlineBS> on diet alone; Labs 10/14 showed BS= 116 and he knows to avoid sweets etc...    He is up-to-date on colonoscopy screening> done 2010 by DrWood [place] & reported wnl- no polyps, divertics, etc...    Exam has noted atrophic testes> he is asymptomatic, feels well, good energy, libido ok, no performance issues, etc...     DJD> on Glucosamine; he has had some knee pain & saw an Ortho at [facility]; he reports improved; we wrote for Mobic15 to try for thumbs etc...    Hx Actinic Keratoses> followed at [facility]... We reviewed prob list, meds, xrays and labs> see below for updates >> he takes several Vits & supplements... He does Yoga work-out 2d/wk...  LABS 10/14:  FLP- ok x LDL=110 on Simva40;  Chems- ok x BS=116;  CBC- wnl;  TSH=0.65;  PSA=0.95...  ~  April 21, 2014:  Add-on appt due to epis of blood in semen> he has seen this over 2 occasions in the last few weeks; denies blood in urine, no pain, no drainage, no f/c/s... He had a vasectomy 20+ yrs ago, no known sequellae... He is on ASA daily for primary prophylaxis... Prev exams have revealed atrophic testes> he is asymptomatic, feels well, good energy, libido ok, no performance issues, etc... Urinalysis is clear... Rec to HOLD his ASA & we will refer to Urology for reassurance & any further eval if needed...   ~  September 08, 2014:  Yearly CPX>  Daniel Carpenter reports doing well- no new complaints or concerns; prev hematospermia resolved spont w/o recurrence- he saw Urology, DrHerrick, who stressed that the prob is benign & rec considering finasteride  rx if needed for recurrent symptoms down the road... 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 We reviewed the following medical problems during today's office visit >>     OSA> see below- stable on CPAP at 6cm pressure, uses regularly, rests well, no issues; he has kept up w/ mask change and machine upgrades...     Pos FamHx CAD> both parents had CAD & CABG's, both died in their early 3370's; he had neg Myoview 2004; on ASA; he remains asymptomatic & we reviewed risk factor reduction strategy; he does Yoga w/ wife & it's a good workout.    Hyperlipid> on Simva40; FLP 11/15 shows TChol 143, TG 76, HDL 47, LDL 81; Rec same med, diet & exercise...    Impaired fasting gluc> on diet alone; Labs 11/15 showed BS= 106 and he knows to avoid sweets etc...    He is up-to-date on colonoscopy screening> done 2010 by DrWood [place] & reported wnl- no polyps, divertics, etc...    Exam has noted atrophic testes> he is asymptomatic, feels well, good energy, libido ok, no performance issues, etc...     Episode of hematospermia> this occurred 6/15 & resolved spont; he saw Urology DrHerrick who confirmed benign etiology & rec considering finasteride if it was to recur...    DJD> on Glucosamine; he has had some knee pain & [facility]; he reports improved; we wrote for Mobic15 to try for thumbs etc...    Hx Act[facility] Keratoses> [facility]... We reviewed prob list, meds, xrays and labs> see below for updates >>   CXR 11/15 showed norm heart size, clear lungs, NAD.Marland Kitchen.Marland Kitchen.  EKG 11/15 showed SBrady, rate46, rsr' in V1, otherw wnl...  LABS 11/15:  FLP- at goals on Simva40;  Chems- ok x BS=106;  CBC- wnl;  TSH=0.83;  PSA=0.92;  UA- clear, no blood or infection...   ~  September 09, 2015:  Yearly ROV & CPX>  Daniel Carpenter reports a good yr overall- he has bilat knee DJD & [facility] ("he trimmed the meniscus") and imroved since  then; he has no other complaints or concerns... we reviewed the following medical problems during today's office visit >>     OSA> see below- stable on CPAP at 6cm pressure, uses regularly, rests well, no issues; he has kept up w/ mask change and machine upgrades...     Pos FamHx CAD> both parents had CAD & CABG's, both died in their early 7270's; he had neg Myoview 2004; on ASA; he remains asymptomatic & we  reviewed risk factor reduction strategy; he does Yoga w/ wife & it's a good workout- no CP/ palpit/ dizzy/ SOB/ edema...    Hyperlipid> on Simva40; FLP 11/16 shows TChol 182, TG 69, HDL 50, LDL 118; Rec- same med, better diet & resume ercise...    Impaired fasting gluc> on diet alone; Labs 11/16 showed BS= 111 and he knows to avoid sweets etc...    He is up-to-date on colonoscopy screening> done 2010 by DrWood [place] & reported wnl- no polyps, divertics, & f/u planned 10 yrs...    Exam has noted atrophic testes> he is asymptomatic, feels well, good energy, libido ok, no performance issues, etc...     Episode of hematospermia> this occurred 6/15 & resolved spont; he saw Urology DrHerrick who confirmed benign etiology & rec considering Finasteride if it was to recur...    DJD- known bilat knee arthritis, s/p right knee arthroscopy 9/16 by DrMartin [facility]> on Glucosamine & Mobic15; he has bilat knee pain (R>L) & [facility]- s/p Right arthroscopy 9/16 & he reports improved...     Hx Actinic Keratoses> [facility]... EXAM shows Afeb, VSS, O2sat=99% on RA;  HEENT- neg, mallamapti2;  Chest- clear w/o w/r/r;  Heart- RR w/o m/r/g;  Abd- soft, nontender, neg;  Ext- neg w/o c/c/e;  Neuro- intact...  LABS 09/09/15>  FLP- on Simva40 but LDL=118;  Chems- ok x FBS=111;  CBC- wnl;  TSH=0.51;  PSA=1.15... IMP/PLAN>>  Daniel Carpenter is stable- s/p right knee arthroscopy & getting back into his exerc program; his FLP & FBS are not quite at goals and we reviewed diet & exercise prescription; Rec to continue  same meds...    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 We reviewed the following medical problems during today's office visit >>     OSA> see below- stable on CPAP at 6cm pressure, uses regularly, rests well, no issues; he has kept up w/ mask change (pillows) and machine upgrades...     Pos FamHx CAD> both parents had CAD & CABG's, both died in their early 5470's; he had neg Myoview 2004; on ASA & vit supplements; he remains asymptomatic & we reviewed risk factor reduction strategy; he does Yoga w/ wife & it's a good workout- no CP/ palpit/ dizzy/ SOB/ edema...    Hyperlipid> on Simva40; FLP 11/17 shows TChol 158, TG 120, HDL 54, LDL 80; Rec- same med, diet & exercise...    Impaired fasting gluc> on diet alone; Labs 11/17 showed BS= 108 and he knows to avoid sweets etc...    He is up-to-date on colonoscopy screening> done [place] DrWood [place] & reported wnl- no polyps, divertics, & f/u planned 10 yrs...    Exam has noted atrophic testes> he is asymptomatic, feels well, good energy, libido ok, no performance issues, etc...     Episode of hematospermia> this occurred 6/15 & resolved spont; he saw Urology DrHerrick who confirmed benign etiology & rec considering Finasteride if it was to recur...    DJD- known bilat knee arthritis, s/p right knee arthroscopy 9/16 by DrMartin [facility]> on Glucosamine & Mobic15; he has bilat knee pain (R>L) & [facility]- s/p Right arthroscopy 9/16 & he reports improved...     Hx Actinic Keratoses>  [facility]... EXAM shows Afeb, VSS, O2sat=97% on RA;  HEENT- neg, mallamapti2;  Chest- clear w/o w/r/r;  Heart- RR w/o m/r/g;  Abd- soft, nontender, neg;  Ext- neg w/o c/c/e;  Neuro- intact...  CXR 09/08/16 (independently reviewed by me in the PACS system) showed norm heart  size, clear lungs- NAD, mild scoliosis & DJD Tspine...  EKG 09/08/16>  SBrady, rate 43, otherw wnl... He denies dizzy/ lightheaded/ syncope/ CP/ palpait/ etc...  LABS 09/08/16>  FLP- all parameters at goals on Simva40;  Chems- wnl w/ BS=108, Cr=0.95, LFTs wnl;  CBC- wnl w/ Hg=15.8;  TSH=0.90;  PSA=1.26;  UA- clear & wnl... IMP/PLAN>>  Daniel Carpenter continues to do well & is asymptomatic; he has a resting sinus bradycardia; very active w/ yoga etc; Labs all look good & he will continue Simva40 & ASA...            Problem List:  PHYSICAL EXAMINATION (ICD-V70.0) - advised ASA 81mg /d... he had a neg FlexSig in 1997 by DrStark and had full colonoscopy 2010 [place] DrWoods [place]> neg, normal w/o divertics or polyps. ~  CXR 12/10 clear, WNL, post op right shoulder... ~  EKG 10/12 showed SBrady, rate 58, WNL.Marland Kitchen.. ~  CXR 10/13 showed normal heart size, clear lungs, WNL... ~  EKG 10/13 showed SBrady, rate 59, WNL, NAD.Marland Kitchen.. ~  CXR 11/15 showed norm heart size, clear lungs, NAD... ~  EKG 11/15 showed SBrady, rate46, rsr' in V1, otherw wnl... ~  CXR 11/17 showed norm heart size, clear lungs- NAD, mild scoliosis & DJD Tspine ~  EKG 11/17 showed SBrady, rate 43, otherw wnl.  OBSTRUCTIVE SLEEP APNEA (ICD-327.23) - Sleep Study 6/04 showed RDI= 10, with desat to 81%... symptoms improved w/ CPAP trial and set at 6cm pressure... ENT eval by DrShoemaker... ~  10/12 & 10/13:  he reports using CPAP nightly, nasal pillows, no difficulty reported. ~  10/14 & 11/15: he continues to do well, reports that he is resting well, should be due for an upgrade & we will contact [facility]... ~  11/16 & 11/17: he continue stable, doing satis & no daytime sleepinesss; keeping up w/ mask changes etc...  Family Hx of CAD (ICD-414.00) - both father & mother died in their early 7670's w/ heart disease and CABG's...  we discussed risk factor reduction and careful monitoring over time... ~  NuclearStressTest 10/04 was neg- no ischemia, no infarction,  EF=61%... ~  10/12 - 10/14:  he remains asymptomatic- no CP, palpit, SOB, edema, etc... ~  11/15: he remains active doing vigorous yoga w/ [place]... ~  11/16: he [facility] & is starting to incr his exercise once again...  HYPERLIPIDEMIA (ICD-272.4) - on SIMVASTATIN 40mg /d...  ~  FLP 8/08 showed TChol 212, TG 144, HDL 44, LDL 140... on diet alone- Rec= start Simva40. ~  FLP 7/09 (on Simva40) showed TChol 135, TG 79, HDL 42, LDL 78 ~  FLP 12/10 (on Simva40) showed TChol 173, TG 104, HDL 33, LDL 119 ~  FLP 10/11 showed TChol 131, TG 66, HDL 51, LDL 67 ~  FLP 10/12 on Simva40 showed TChol 152, TG 120, HDL 45, LDL 83 ~  FLP 10/13 on Simva40 showed TChol 167, TG 104, HDL 46, LDL 101 ~  FLP 10/14 on Simva40 showed TChol 185, TG 115, HDL 52, LDL 110... Rec- same med, better diet & exercise. ~  FLP 11/15 on Simva40 showed TChol 143, TG  76, HDL 47, LDL 81  ~  FLP 11/16 on Simva40 showed TChol 182, TG 69, HDL 50, LDL 118... We reviewed diet & exercise program. ~  FLP 11/17 on Simva40 showed TChol 158, TG 120, HDL 54, LDL 80; Rec- same med, diet & exercise...  IMPAIRED FASTING GLUCOSE - +fam hx w/ 1 sis passed away w/ DM, ?hrt dis... ~  labs 8/08 w/ FBS= 121... advised low carb diet etc... he reports home BS= 100-110 range. ~  labs 7/09 showed BS= 116 ~  labs 12/10 showed BS= 107 ~  labs 10/11 showed BS= 107 ~  Labs 10/12 showed BS= 111 ~  Labs 10/13 on diet alone (wt=202#) showed BS= 108 ~  Labs 10/14 on diet alone (wt is down 8# to 194#) showed BS= 116 ~  Labs 11/15 on diet alone (wt=201#) showed BS= 106...  ~  Labs 11/16 on diet alone (wt=196#) showed BS= 111 ~  LABS 11/17 on diet alone (wt=198#) showed BS=108  GI >> He is up-to-date on colonoscopy screening> done 2010 [place] DrWood [place] & reported wnl- no polyps, divertics, etc..   GU >> Hematospermia >> 2 episodes 6/15, otherw neg hx & exam, s/p vasectomy 20+yrs ago, urinalysis is neg,  asked to HOLD ASA & we will refer to Urology... ~  He had a vasectomy >20 yrs ago... ~  He is also noted to have atrophic testes but is totally asymptomatic, w/o complaints...   DJD >> Notes some right knee problem & has seen Ortho in [place]; prev right rotator cuff surg 9/10 [place] as well... ~  10/14: c/o pain at MCPs of thumbs- rec for heat, Mobic15, refer to Ortho if not responding... 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  Hx of ACTINIC KERATOSIS (ICD-702.0) - he is followed by Derm at [facility]...  HEALTH MAINTENANCE: ~  GI:  He had Colonoscopy 2010 by DrWood [place]> neg w/o polyps, divertics, etc; rec f/u 7-20yrs... ~  GU:  No LTOS, DRE is benign  ~  Immuniz:  He gets the yearly Flu vaccines...   Past Surgical History:  Procedure Laterality Date  . NASAL SEPTUM SURGERY  1998  . ROTATOR CUFF REPAIR  2010   right    Outpatient Encounter Prescriptions as of 09/08/2016  Medication Sig Dispense Refill  . aspirin 81 MG tablet Take 81 mg by mouth daily. HOLD    . cholecalciferol (VITAMIN D) 1000 UNITS tablet Take 1 tablet (1,000 Units total) by mouth daily. 90 tablet 3  . fish oil-omega-3 fatty acids 1000 MG capsule Take 2 capsules (2 g total) by mouth daily. 90 capsule 3  . Glucosamine-Chondroit-Vit C-Mn (GLUCOSAMINE 1500 COMPLEX) CAPS Take 2 capsules by mouth daily. 180 capsule 3  . meloxicam (MOBIC) 15 MG tablet TAKE 1 TABLET(15 MG) BY MOUTH DAILY AS NEEDED FOR PAIN 30 tablet 6  . Omega-3 Fatty Acids (FISH OIL) 1000 MG CAPS Take 2 capsules by mouth daily 180 capsule 3  . simvastatin (ZOCOR) 40 MG tablet Take 1 tablet (40 mg total) by mouth at bedtime. 90 tablet 3  . vitamin B-12 (CYANOCOBALAMIN) 500 MCG tablet Take 1 tablet (500 mcg total) by mouth daily. 90 tablet 3  . vitamin C (ASCORBIC ACID) 500 MG tablet Take 2 tablets (1,000 mg total) by  mouth daily. 90 tablet 3  . [  DISCONTINUED] simvastatin (ZOCOR) 40 MG tablet Take 1 tablet by mouth  every night at bedtime 90 tablet 3   No facility-administered encounter medications on file as of 09/08/2016.     No Known Allergies   Current Medications, Allergies, Past Medical History, Past Surgical History, Family History, and Social History were reviewed in [employer] record.    Review of Systems    The patient denies fever, chills, sweats, anorexia, fatigue, weakness, malaise, weight loss, sleep disorder, blurring, diplopia, eye irritation, eye discharge, vision loss, eye pain, photophobia, earache, ear discharge, tinnitus, decreased hearing, nasal congestion, nosebleeds, sore throat, hoarseness, chest pain, palpitations, syncope, dyspnea on exertion, orthopnea, PND, peripheral edema, cough, dyspnea at rest, excessive sputum, hemoptysis, wheezing, pleurisy, nausea, vomiting, diarrhea, constipation, change in bowel habits, abdominal pain, melena, hematochezia, jaundice, gas/bloating, indigestion/heartburn, dysphagia, odynophagia, dysuria, hematuria, urinary frequency, urinary hesitancy, nocturia, incontinence, back pain, joint pain, joint swelling, muscle cramps, muscle weakness, stiffness, arthritis, sciatica, restless legs, leg pain at night, leg pain with exertion, rash, itching, dryness, suspicious lesions, paralysis, paresthesias, seizures, tremors, vertigo, transient blindness, frequent falls, frequent headaches, difficulty walking, depression, anxiety, memory loss, confusion, cold intolerance, heat intolerance, polydipsia, polyphagia, polyuria, unusual weight change, abnormal bruising, bleeding, enlarged lymph nodes, urticaria, allergic rash, hay fever, and recurrent infections.     Objective:   Physical Exam     WD, WN, 60 y/o WM in NAD... GENERAL:  Alert & oriented; pleasant & cooperative... HEENT:  [place]/AT, EOM-wnl, PERRLA, Fundi-benign, EACs-clear, TMs-wnl,  NOSE-clear, THROAT-clear & wnl. NECK:  Supple w/ full ROM; no JVD; normal carotid impulses w/o bruits; no thyromegaly or nodules palpated; no lymphadenopathy. CHEST:  Clear to P & A; without wheezes/ rales/ or rhonchi. HEART:  Regular Rhythm; without murmurs/ rubs/ or gallops. ABDOMEN:  Soft & nontender; normal bowel sounds; no organomegaly or masses detected. RECTAL:  Neg - prostate 2+ & nontender w/o nodules; stool hematest neg. EXT: without deformities +arthritic changes R>L knee; no varicose veins/ venous insuffic/ or edema. s/p right rotator cuff surg... NEURO:  CN's intact; motor testing normal; sensory testing normal; gait normal & balance OK. DERM:  No lesions noted; no rash etc >> followed [facility] Derm at [facility]...  RADIOLOGY DATA:  Reviewed in the EPIC EMR & discussed w/ the patient...  LABORATORY DATA:  Reviewed in the EPIC EMR & discussed w/ the patient...   Assessment & Plan:    OSA>  Stable on CPAP w/o issues; continue same...  Fam Hx CAD>  Aware, pt remains asymptomatic, exercises regularly, doing well...  CHOL>  On Simva40 & FLP looks OK; continue same...  Impaired fasting gluc>  +FamHx & FBS=108, discussed diet exercise etc...  Hx Blood in semen>  This occurred 6/15, resolved spont, no recurrence... Hx sl atrophic testes but he denies Low-T symptoms and we have not pursued this diagnosis in the past... Hx vasectomy 20+ yrs ago w/o sequelae...  DJD>  S/p right rotator cuff surg 9/10 [place], and bilat knee pain w/ XRays and MRI confirming bilat knee arthritis R>L & meniscus tear + chondromalacia patella=> he had right knee athroscopy 9/16 by DrMartin.  Other medical problems as listed...   Patient's Medications  New Prescriptions   No medications on file  Previous Medications   ASPIRIN 81 MG TABLET    Take 81 mg by mouth daily. HOLD   CHOLECALCIFEROL (VITAMIN D) 1000 UNITS TABLET    Take 1 tablet (1,000 Units total) by mouth daily.   FISH OIL-OMEGA-3 FATTY ACIDS  1000  MG CAPSULE    Take 2 capsules (2 g total) by mouth daily.   GLUCOSAMINE-CHONDROIT-VIT C-MN (GLUCOSAMINE 1500 COMPLEX) CAPS    Take 2 capsules by mouth daily.   MELOXICAM (MOBIC) 15 MG TABLET    TAKE 1 TABLET(15 MG) BY MOUTH DAILY AS NEEDED FOR PAIN   OMEGA-3 FATTY ACIDS (FISH OIL) 1000 MG CAPS    Take 2 capsules by mouth daily   VITAMIN B-12 (CYANOCOBALAMIN) 500 MCG TABLET    Take 1 tablet (500 mcg total) by mouth daily.   VITAMIN C (ASCORBIC ACID) 500 MG TABLET    Take 2 tablets (1,000 mg total) by mouth daily.  Modified Medications   Modified Medication Previous Medication   SIMVASTATIN (ZOCOR) 40 MG TABLET simvastatin (ZOCOR) 40 MG tablet      Take 1 tablet (40 mg total) by mouth at bedtime.    Take 1 tablet by mouth  every night at bedtime  Discontinued Medications   No medications on file

## 2017-04-11 ENCOUNTER — Other Ambulatory Visit: Payer: Self-pay | Admitting: Pulmonary Disease

## 2017-04-19 ENCOUNTER — Telehealth: Payer: Self-pay | Admitting: Pulmonary Disease

## 2017-04-19 DIAGNOSIS — Z0000 Encounter for general adult medical examination without abnormal findings: Secondary | ICD-10-CM

## 2017-04-19 NOTE — Telephone Encounter (Signed)
Pt has an upcoming appointment in November. He would like to have his labs placed now.  SN - please advise what labs you would like on this pt. Thanks!

## 2017-04-20 NOTE — Telephone Encounter (Signed)
Per SN---  Ok to have labs done in Nov 2018 at his appt.   These orders have been placed and nothing further is needed. Pt is aware.

## 2017-06-23 ENCOUNTER — Other Ambulatory Visit: Payer: Self-pay | Admitting: Pulmonary Disease

## 2017-08-21 ENCOUNTER — Telehealth: Payer: Self-pay | Admitting: Pulmonary Disease

## 2017-08-21 DIAGNOSIS — K429 Umbilical hernia without obstruction or gangrene: Secondary | ICD-10-CM

## 2017-08-21 NOTE — Telephone Encounter (Signed)
ATC pt, no answer. Left message for pt to call back.  

## 2017-08-22 NOTE — Telephone Encounter (Signed)
LMTCB x2  

## 2017-08-23 NOTE — Telephone Encounter (Signed)
Spoke with pt, he states he states he needs evaluation about the possible hernia on his umbilicus that was discussed in the last OV. SN can we refer? He states it is getting worse and painful.

## 2017-08-23 NOTE — Telephone Encounter (Signed)
Patient returning call - he can be reached at [phone] -pr

## 2017-08-24 NOTE — Telephone Encounter (Signed)
Per SN---  Ok to refer the pt to [facility] for eval of umbilical hernia.  thanks

## 2017-08-24 NOTE — Telephone Encounter (Signed)
Referral placed. Advised pt and nothing further is needed.  

## 2017-08-30 ENCOUNTER — Other Ambulatory Visit (INDEPENDENT_AMBULATORY_CARE_PROVIDER_SITE_OTHER): Payer: 59

## 2017-08-30 DIAGNOSIS — Z0000 Encounter for general adult medical examination without abnormal findings: Secondary | ICD-10-CM | POA: Diagnosis not present

## 2017-08-30 LAB — COMPREHENSIVE METABOLIC PANEL
ALK PHOS: 52 U/L (ref 39–117)
ALT: 22 U/L (ref 0–53)
AST: 17 U/L (ref 0–37)
Albumin: 4.3 g/dL (ref 3.5–5.2)
BILIRUBIN TOTAL: 0.7 mg/dL (ref 0.2–1.2)
BUN: 18 mg/dL (ref 6–23)
CO2: 30 mEq/L (ref 19–32)
Calcium: 9.4 mg/dL (ref 8.4–10.5)
Chloride: 103 mEq/L (ref 96–112)
Creatinine, Ser: 0.96 mg/dL (ref 0.40–1.50)
GFR: 84.81 mL/min (ref >60.00)
Glucose, Bld: 106 mg/dL — ABNORMAL HIGH (ref 70–99)
POTASSIUM: 4.3 meq/L (ref 3.5–5.1)
Sodium: 141 mEq/L (ref 135–145)
TOTAL PROTEIN: 6.8 g/dL (ref 6.0–8.3)

## 2017-08-30 LAB — URINALYSIS
BILIRUBIN URINE: NEGATIVE
KETONES UR: NEGATIVE
Leukocytes, UA: NEGATIVE
Nitrite: NEGATIVE
PH: 6 (ref 5.0–8.0)
Specific Gravity, Urine: 1.025 (ref 1.000–1.030)
TOTAL PROTEIN, URINE-UPE24: NEGATIVE
URINE GLUCOSE: NEGATIVE
Urobilinogen, UA: 0.2 (ref 0.0–1.0)

## 2017-08-30 LAB — CBC WITH DIFFERENTIAL/PLATELET
BASOS PCT: 0.6 % (ref 0.0–3.0)
Basophils Absolute: 0 10*3/uL (ref 0.0–0.1)
EOS ABS: 0.2 10*3/uL (ref 0.0–0.7)
Eosinophils Relative: 2.9 % (ref 0.0–5.0)
HEMATOCRIT: 47.8 % (ref 39.0–52.0)
Hemoglobin: 16.2 g/dL (ref 13.0–17.0)
LYMPHS ABS: 1.6 10*3/uL (ref 0.7–4.0)
Lymphocytes Relative: 29.3 % (ref 12.0–46.0)
MCHC: 33.9 g/dL (ref 30.0–36.0)
MCV: 93.2 fl (ref 78.0–100.0)
MONO ABS: 0.5 10*3/uL (ref 0.1–1.0)
Monocytes Relative: 8.4 % (ref 3.0–12.0)
NEUTROS ABS: 3.3 10*3/uL (ref 1.4–7.7)
NEUTROS PCT: 58.8 % (ref 43.0–77.0)
PLATELETS: 264 10*3/uL (ref 150.0–400.0)
RBC: 5.12 Mil/uL (ref 4.22–5.81)
RDW: 12.9 % (ref 11.5–15.5)
WBC: 5.6 10*3/uL (ref 4.0–10.5)

## 2017-08-30 LAB — TSH: TSH: 0.78 u[IU]/mL (ref 0.35–4.50)

## 2017-08-30 LAB — PSA: PSA: 1.2 ng/mL (ref 0.10–4.00)

## 2017-08-30 LAB — LIPID PANEL
CHOL/HDL RATIO: 3
Cholesterol: 152 mg/dL (ref 0–200)
HDL: 52.7 mg/dL (ref >39.00)
LDL Cholesterol: 82 mg/dL (ref 0–99)
NonHDL: 99.63
Triglycerides: 87 mg/dL (ref 0.0–149.0)
VLDL: 17.4 mg/dL (ref 0.0–40.0)

## 2017-09-14 ENCOUNTER — Ambulatory Visit: Payer: 59 | Admitting: Pulmonary Disease

## 2017-09-25 ENCOUNTER — Other Ambulatory Visit: Payer: Self-pay | Admitting: *Deleted

## 2017-09-25 ENCOUNTER — Telehealth: Payer: Self-pay | Admitting: Pulmonary Disease

## 2017-09-25 MED ORDER — ALIGN PO CAPS: 1.0000 | ORAL_CAPSULE | Freq: Every day | ORAL | 0 refills | Status: DC

## 2017-09-25 MED ORDER — SALINE SPRAY 0.65 % NA SOLN: 1.0000 | NASAL | 0 refills | Status: DC | PRN

## 2017-09-25 MED ORDER — AMOXICILLIN-POT CLAVULANATE 875-125 MG PO TABS: 1.0000 | ORAL_TABLET | Freq: Two times a day (BID) | ORAL | 0 refills | Status: DC

## 2017-09-25 NOTE — Telephone Encounter (Signed)
VS please advise, as NS has left for the afternoon. Thanks.

## 2017-09-25 NOTE — Telephone Encounter (Signed)
Called and spoke with pt. pt reports of temp of 100, sweats, sinus pressure & sore throat X2d. Pt denies vomiting or diarrhea.   SN please advise. Thanks.

## 2017-09-25 NOTE — Telephone Encounter (Signed)
Please call in script for augmentin 875 mg bid x 7 days, with no refills.  If no improvement, then needs ROV.

## 2017-09-25 NOTE — Telephone Encounter (Signed)
Called pt and advised message from the provider. Pt understood and verbalized understanding. Nothing further is needed.    

## 2017-09-27 ENCOUNTER — Encounter: Payer: Self-pay | Admitting: Pulmonary Disease

## 2017-09-27 ENCOUNTER — Ambulatory Visit (INDEPENDENT_AMBULATORY_CARE_PROVIDER_SITE_OTHER): Payer: 59 | Admitting: Pulmonary Disease

## 2017-09-27 VITALS — BP 120/84 | HR 69 | Temp 97.6°F | Ht 71.0 in | Wt 198.0 lb

## 2017-09-27 DIAGNOSIS — Z0000 Encounter for general adult medical examination without abnormal findings: Secondary | ICD-10-CM

## 2017-09-27 DIAGNOSIS — G4733 Obstructive sleep apnea (adult) (pediatric): Secondary | ICD-10-CM

## 2017-09-27 DIAGNOSIS — R7301 Impaired fasting glucose: Secondary | ICD-10-CM

## 2017-09-27 DIAGNOSIS — M170 Bilateral primary osteoarthritis of knee: Secondary | ICD-10-CM

## 2017-09-27 DIAGNOSIS — E7800 Pure hypercholesterolemia, unspecified: Secondary | ICD-10-CM

## 2017-09-27 DIAGNOSIS — Z23 Encounter for immunization: Secondary | ICD-10-CM

## 2017-09-27 DIAGNOSIS — K429 Umbilical hernia without obstruction or gangrene: Secondary | ICD-10-CM

## 2017-09-27 MED ORDER — MELOXICAM 15 MG PO TABS: 15.0000 mg | ORAL_TABLET | Freq: Every day | ORAL | 5 refills | Status: DC | PRN

## 2017-09-27 MED ORDER — TETANUS-DIPHTH-ACELL PERTUSSIS 5-2.5-18.5 LF-MCG/0.5 IM SUSP
0.5000 mL | Freq: Once | INTRAMUSCULAR | Status: AC
  Administered 2017-09-27: 0.5 mL via INTRAMUSCULAR

## 2017-09-27 NOTE — Patient Instructions (Signed)
Today we updated your med list in our EPIC system...    Continue your current medications the same...    We refilled your Meloxicam per request...  Today we reviewed your recent FASTING blood work results...  We gave you the 2018 FLU vaccine & a tetanus booster caller the TDAP (good for 10 yrs)...  We will arrange for a referral to [facility] for your umbilical hernia...  Call for any questions or if we can be of service in any way...  Let's plan a follow up visit in [id number], sooner if needed for problems.Marland Kitchen..Marland Kitchen

## 2017-09-27 NOTE — Progress Notes (Signed)
Subjective:    Patient ID: Daniel Carpenter, male    DOB: 11-Sep-1956, 61 y.o.   MRN: [id number]  HPI 61 y/o WM here for a follow up visit and CPX...  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  We reviewed the following medical problems during today's office visit>>     OSA> see below- stable on CPAP at 6cm pressure, uses regularly, rests well, no issues...    Pos FamHx CAD> both parents had CAD & CABG's, both died in their early 69's; he had neg Myoview 2004; he remains asymptomatic & we reviewed risk factor reduction strategy; he will also incr his exercise program...    Hyperlipid> on Simva40; FLP shows TChol 167, TG 104, HDL 46, LDL 101    BorderlineBS> on diet alone; Labs showed BS= 108 and he knows to avoid sweets etc...    He is up-to-date on colonoscopy screening> done 2010 by DrWood [place] & reported wnl- no polyps, divertics, etc...    Exam has noted atrophic testes> he is asymptomatic, feels well, good energy, libido ok, no performance issues, etc...     DJD> he has had some knee pain & saw an Ortho at [facility]; he reports improved...    Hx Actinic Keratoses> followed at [facility]... We reviewed prob list, meds, xrays and labs> see below for updates >> ok Flu shot today...  CXR 10/13 showed normal heart size, clear lungs, WNL.Marland KitchenMarland Kitchen  EKG 10/13 showed SBrady, rate 59, WNL, NAD...  LABS 10/13:  FLP- at goal on Simva40;  Chems- ok x BS=108;  CBC- wnl;  TSH=0.73;  PSA=1.13;  UA- clear...  ~  August 29, 2013:  Yearly ROV & CPX> Daniel Carpenter's only complaint is pain in his thumbs bilat, tender MCPs, and we discussed Mobic15, hot soaks, refer to Ortho is not responding... We reviewed the following medical problems during today's office visit >>     OSA> see below- stable on CPAP at 6cm pressure, uses regularly,  rests well, no issues; he is due for an upgrade & we will contact [facility]...    Pos FamHx CAD> both parents had CAD & CABG's, both died in their early 61's; he had neg Myoview 2004; he remains asymptomatic & we reviewed risk factor reduction strategy; he will also incr his exercise program...    Hyperlipid> on Simva40; FLP 10/14 shows TChol 185, TG 115, HDL 52, LDL 110; Rec same med, better diet & exercise...    BorderlineBS> on diet alone; Labs 10/14 showed BS= 116 and he knows to avoid sweets etc...    He is up-to-date on colonoscopy screening> done 2010 by DrWood [place] & reported wnl- no polyps, divertics, etc...    Exam has noted atrophic testes> he is asymptomatic, feels well, good energy, libido ok, no performance issues, etc...     DJD> on Glucosamine; he has had some knee pain & saw an Ortho at [facility]; he reports improved; we wrote for Mobic15 to try for thumbs etc...    Hx Actinic Keratoses> followed at [facility]... We reviewed prob list, meds, xrays and labs> see below for updates >> he takes several Vits & supplements... He does Yoga work-out 2d/wk...  LABS 10/14:  FLP- ok x LDL=110 on Simva40;  Chems- ok x BS=116;  CBC- wnl;  TSH=0.65;  PSA=0.95...  ~  April 21, 2014:  Add-on appt due to epis of blood in semen> he has seen this over 2 occasions in the last few weeks; denies blood in urine, no pain, no drainage, no f/c/s... He had a vasectomy 20+ yrs ago, no known sequellae... He is on ASA daily for primary prophylaxis... Prev exams have revealed atrophic testes> he is asymptomatic, feels well, good energy, libido ok, no performance issues, etc... Urinalysis is clear... Rec to HOLD his ASA & we will refer to Urology for reassurance & any further eval if needed...   ~  September 08, 2014:  Yearly CPX>  Daniel Carpenter reports doing well- no new complaints or concerns; prev hematospermia resolved spont w/o recurrence- he saw Urology, DrHerrick, who stressed that the prob is benign & rec considering finasteride  rx if needed for recurrent symptoms down the road... 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 We reviewed the following medical problems during today's office visit >>     OSA> see below- stable on CPAP at 6cm pressure, uses regularly, rests well, no issues; he has kept up w/ mask change and machine upgrades...     Pos FamHx CAD> both parents had CAD & CABG's, both died in their early 3370's; he had neg Myoview 2004; on ASA; he remains asymptomatic & we reviewed risk factor reduction strategy; he does Yoga w/ wife & it's a good workout.    Hyperlipid> on Simva40; FLP 11/15 shows TChol 143, TG 76, HDL 47, LDL 81; Rec same med, diet & exercise...    Impaired fasting gluc> on diet alone; Labs 11/15 showed BS= 106 and he knows to avoid sweets etc...    He is up-to-date on colonoscopy screening> done 2010 by DrWood [place] & reported wnl- no polyps, divertics, etc...    Exam has noted atrophic testes> he is asymptomatic, feels well, good energy, libido ok, no performance issues, etc...     Episode of hematospermia> this occurred 6/15 & resolved spont; he saw Urology DrHerrick who confirmed benign etiology & rec considering finasteride if it was to recur...    DJD> on Glucosamine; he has had some knee pain & [facility]; he reports improved; we wrote for Mobic15 to try for thumbs etc...    Hx Act[facility] Keratoses> [facility]... We reviewed prob list, meds, xrays and labs> see below for updates >>   CXR 11/15 showed norm heart size, clear lungs, NAD.Marland Kitchen.Marland Kitchen.  EKG 11/15 showed SBrady, rate46, rsr' in V1, otherw wnl...  LABS 11/15:  FLP- at goals on Simva40;  Chems- ok x BS=106;  CBC- wnl;  TSH=0.83;  PSA=0.92;  UA- clear, no blood or infection...   ~  September 09, 2015:  Yearly ROV & CPX>  Gerlene BurdockRichard reports a good yr overall- he has bilat knee DJD & [facility] ("he trimmed the meniscus") and imroved since  then; he has no other complaints or concerns... we reviewed the following medical problems during today's office visit >>     OSA> see below- stable on CPAP at 6cm pressure, uses regularly, rests well, no issues; he has kept up w/ mask change and machine upgrades...     Pos FamHx CAD> both parents had CAD & CABG's, both died in their early 7270's; he had neg Myoview 2004; on ASA; he remains asymptomatic & we  reviewed risk factor reduction strategy; he does Yoga w/ wife & it's a good workout- no CP/ palpit/ dizzy/ SOB/ edema...    Hyperlipid> on Simva40; FLP 11/16 shows TChol 182, TG 69, HDL 50, LDL 118; Rec- same med, better diet & resume ercise...    Impaired fasting gluc> on diet alone; Labs 11/16 showed BS= 111 and he knows to avoid sweets etc...    He is up-to-date on colonoscopy screening> done 2010 by DrWood [place] & reported wnl- no polyps, divertics, & f/u planned 10 yrs...    Exam has noted atrophic testes> he is asymptomatic, feels well, good energy, libido ok, no performance issues, etc...     Episode of hematospermia> this occurred 6/15 & resolved spont; he saw Urology DrHerrick who confirmed benign etiology & rec considering Finasteride if it was to recur...    DJD- known bilat knee arthritis, s/p right knee arthroscopy 9/16 by Dr[facility]> on Glucosamine & Mobic15; he has bilat knee pain (R>L) & saw Ortho-DrMartin [facility]- s/p Right arthroscopy 9/16 & he reports improved...     Hx Actinic Keratoses> followed at [facility]... EXAM shows Afeb, VSS, O2sat=99% on RA;  HEENT- neg, mallamapti2;  Chest- clear w/o w/r/r;  Heart- RR w/o m/r/g;  Abd- soft, nontender, neg;  Ext- neg w/o c/c/e;  Neuro- intact...  LABS 09/09/15>  FLP- on Simva40 but LDL=118;  Chems- ok x FBS=111;  CBC- wnl;  TSH=0.51;  PSA=1.15... IMP/PLAN>>  Morse is stable- s/p right knee arthroscopy & getting back into his exerc program; his FLP & FBS are not quite at goals and we reviewed diet & exercise prescription; Rec to continue  same meds...   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 We reviewed the following medical problems during today's office visit >>     OSA> see below- stable on CPAP at 6cm pressure, uses regularly, rests well, no issues; he has kept up w/ mask change (pillows) and machine upgrades...     Pos FamHx CAD> both parents had CAD & CABG's, both died in their early 75's; he had neg Myoview 2004; on ASA & vit supplements; he remains asymptomatic & we reviewed risk factor reduction strategy; he does Yoga w/ wife & it's a good workout- no CP/ palpit/ dizzy/ SOB/ edema...    Hyperlipid> on Simva40; FLP 11/17 shows TChol 158, TG 120, HDL 54, LDL 80; Rec- same med, diet & exercise...    Impaired fasting gluc> on diet alone; Labs 11/17 showed BS= 108 and he knows to avoid sweets etc...    He is up-to-date on colonoscopy screening> done [place] DrWood [place] & reported wnl- no polyps, divertics, & f/u planned 10 yrs...    Exam has noted atrophic testes> he is asymptomatic, feels well, good energy, libido ok, no performance issues, etc...     Episode of hematospermia> this occurred 6/15 & resolved spont; he saw Urology DrHerrick who confirmed benign etiology & rec considering Finasteride if it was to recur...    DJD- known bilat knee arthritis, s/p right knee arthroscopy 9/16 by DrMartin [facility]> on Glucosamine & Mobic15; he has bilat knee pain (R>L) & saw Ortho-DrMartin at [facility]- s/p Right arthroscopy 9/16 & he reports improved...     Hx Actinic Keratoses> [facility]... EXAM shows Afeb, VSS, O2sat=97% on RA;  HEENT- neg, mallamapti2;  Chest- clear w/o w/r/r;  Heart- RR w/o m/r/g;  Abd- soft, nontender, neg;  Ext- neg w/o c/c/e;  Neuro- intact...  CXR 09/08/16 (independently reviewed by me in the PACS system) showed norm heart  size, clear lungs- NAD, mild scoliosis & DJD Tspine...  EKG 09/08/16>  SBrady, rate 43, otherw wnl... He denies dizzy/ lightheaded/ syncope/ CP/ palpait/ etc...  LABS 09/08/16>  FLP- all parameters at goals on Simva40;  Chems- wnl w/ BS=108, Cr=0.95, LFTs wnl;  CBC- wnl w/ Hg=15.8;  TSH=0.90;  PSA=1.26;  UA- clear & wnl... IMP/PLAN>>  Gerlene BurdockRichard continues to do well & is asymptomatic; he has a resting sinus bradycardia; very active w/ yoga etc; Labs all look good & he will continue Simva40 & ASA...    ~  September 27, 2017:  [age]yr ROV & CPX>  Raiford NobleRick returns for a routine yearly CPX- reports doing well, no new complaints or concerns; his only prescription med is Simva40, he takes Glucosamine/chondroitin for his joints and we are prescribing Meloxicam15 prn arthritis pain... We reviewed the following medical problems during today's office visit >>     OSA> see below- stable on CPAP at 6cm pressure, uses regularly, rests well, no issues; he has kept up w/ mask change (pillows) and machine upgrades...     Pos FamHx CAD> both parents had CAD & CABG's, both died in their early 4270's; he had neg Myoview 2004; on ASA & vit supplements; he remains asymptomatic & we reviewed risk factor reduction strategy; he does Yoga w/ wife & it's a good workout- no CP/ palpit/ dizzy/ SOB/ edema...    Hyperlipid> on Simva40; FLP 10/18 is stable w/ TChol 152, TG 87, HDL 53, LDL 82; Rec- same med, diet & exercise...    Impaired fasting gluc> on diet alone; Labs 11/17 showed BS= 108 and labs 10/18 shows FBS= 106; he knows to avoid sweets etc...    He is up-to-date on colonoscopy screening> done [place] DrWood [place] & reported wnl- no polyps, divertics, & f/u planned 10 yrs...    Exam has noted atrophic testes> he is asymptomatic, feels well, good energy, libido ok, no performance issues, etc...     Episode of hematospermia> this occurred 6/15 & resolved spont; he saw Urology DrHerrick who confirmed benign etiology & rec considering  Finasteride if it was to recur...    DJD- known bilat knee arthritis, s/p right knee arthroscopy 9/16 by DrMartin [facility]> on Glucosamine & Mobic15; he has bilat knee pain (R>L) & [facility]- s/p Right arthroscopy 9/16 & he reports improved...     Hx Actinic Keratoses> [facility]... EXAM shows Afeb, VSS, O2sat=97% on RA;  HEENT- neg, mallamapti2;  Chest- clear w/o w/r/r;  Heart- RR w/o m/r/g;  Abd- soft, nontender, neg;  Ext- neg w/o c/c/e;  Neuro- intact...  LABS 08/30/17>  FLP- at goals on Simva40;  Chems- wnl w/ BS=106;  CBC- wnl w/ Hg=16.2;  TSH=0.78;  PSA=1.20 IMP/PLAN>>  Raiford NobleRick continues to do well, stable on Simva40, & uses Glucosamine/ Meloxicam; he is very active & motivated- continue same; REC Flu shot & due for Tdap...           Problem List:  PHYSICAL EXAMINATION (ICD-V70.0) - advised ASA 81mg /d... he had a neg FlexSig in 1997 by DrStark and had full colon[place] DrWoods [place]> neg, normal w/o divertics or polyps. ~  CXR  12/10 clear, WNL, post op right shoulder... ~  EKG 10/12 showed SBrady, rate 58, WNL.Marland Kitchen. ~  CXR 10/13 showed normal heart size, clear lungs, WNL... ~  EKG 10/13 showed SBrady, rate 59, WNL, NAD.Marland Kitchen. ~  CXR 11/15 showed norm heart size, clear lungs, NAD... ~  EKG 11/15 showed SBrady, rate46, rsr' in V1, otherw wnl... ~  CXR 11/17 showed norm heart size, clear lungs- NAD, mild scoliosis & DJD Tspine ~  EKG 11/17 showed SBrady, rate 43, otherw wnl.  OBSTRUCTIVE SLEEP APNEA (ICD-327.23) - Sleep Study 6/04 showed RDI= 10, with desat to 81%... symptoms improved w/ CPAP trial and set at 6cm pressure... ENT eval by DrShoemaker... ~  10/12 & 10/13:  he reports using CPAP nightly, nasal pillows, no difficulty reported. ~  10/14 & 11/15: he continues to do well, reports that he is resting well, should be due for an upgrade & we will contact [facility]... ~  11/16 & 11/17: he continue stable, doing satis & no daytime sleepinesss; keeping up w/ mask changes  etc...  Family Hx of CAD (ICD-414.00) - both father & mother died in their early 38's w/ heart disease and CABG's...  we discussed risk factor reduction and careful monitoring over time... ~  NuclearStressTest 10/04 was neg- no ischemia, no infarction, EF=61%... ~  10/12 - 10/14:  he remains asymptomatic- no CP, palpit, SOB, edema, etc... ~  11/15: he remains active doing vigorous yoga w/ his wife at classes in [place]... ~  11/16: he had right knee surg 07/2015 by [facility]Martin at [facility] & is starting to incr his exercise once again...  HYPERLIPIDEMIA (ICD-272.4) - on SIMVASTATIN 40mg /d...  ~  FLP 8/08 showed TChol 212, TG 144, HDL 44, LDL 140... on diet alone- Rec= start Simva40. ~  FLP 7/09 (on Simva40) showed TChol 135, TG 79, HDL 42, LDL 78 ~  FLP 12/10 (on Simva40) showed TChol 173, TG 104, HDL 33, LDL 119 ~  FLP 10/11 showed TChol 131, TG 66, HDL 51, LDL 67 ~  FLP 10/12 on Simva40 showed TChol 152, TG 120, HDL 45, LDL 83 ~  FLP 10/13 on Simva40 showed TChol 167, TG 104, HDL 46, LDL 101 ~  FLP 10/14 on Simva40 showed TChol 185, TG 115, HDL 52, LDL 110... Rec- same med, better diet & exercise. ~  FLP 11/15 on Simva40 showed TChol 143, TG 76, HDL 47, LDL 81  ~  FLP 11/16 on Simva40 showed TChol 182, TG 69, HDL 50, LDL 118... We reviewed diet & exercise program. ~  FLP 11/17 on Simva40 showed TChol 158, TG 120, HDL 54, LDL 80; Rec- same med, diet & exercise...  IMPAIRED FASTING GLUCOSE - +fam hx w/ 1 sis passed away w/ DM, ?hrt dis... ~  labs 8/08 w/ FBS= 121... advised low carb diet etc... he reports home BS= 100-110 range. ~  labs 7/09 showed BS= 116 ~  labs 12/10 showed BS= 107 ~  labs 10/11 showed BS= 107 ~  Labs 10/12 showed BS= 111 ~  Labs 10/13 on diet alone (wt=202#) showed BS= 108 ~  Labs 10/14 on diet alone (wt is down 8# to 194#) showed BS= 116 ~  Labs 11/15 on diet alone (wt=201#) showed BS= 106...  ~  Labs 11/16 on diet alone (wt=196#) showed BS= 111 ~  LABS 11/17 on diet alone  (wt=198#) showed BS=108  GI >> He is up-to-date on colonoscopy screening> done [place] DrWood [place] & reported wnl- no polyps, divertics,  etc..   GU >> Hematospermia >> 2 episodes 6/15, otherw neg hx & exam, s/p vasectomy 20+yrs ago, urinalysis is neg, asked to HOLD ASA & we will refer to Urology... ~  He had a vasectomy >20 yrs ago... ~  He is also noted to have atrophic testes but is totally asymptomatic, w/o complaints...   DJD >> Notes some right knee problem & has seen Ortho in [place]; prev right rotator cuff surg 9/10 [place] as well... ~  10/14: c/o pain at MCPs of thumbs- rec for heat, Mobic15, refer to Ortho if not responding... 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  Hx of ACTINIC KERATOSIS (ICD-702.0) - he is followed by Derm at [facility]...  HEALTH MAINTENANCE: ~  GI:  He had Colonoscopy 2010 by DrWood [place]> neg w/o polyps, divertics, etc; rec f/u 7-66yrs... ~  GU:  No LTOS, DRE is benign  ~  Immuniz:  He gets the yearly Flu vaccines...   Past Surgical History:  Procedure Laterality Date  . NASAL SEPTUM SURGERY  1998  . ROTATOR CUFF REPAIR  2010   right    Outpatient Encounter Medications as of 09/27/2017  Medication Sig  . cholecalciferol (VITAMIN D) 1000 UNITS tablet Take 1 tablet (1,000 Units total) by mouth daily.  . fish oil-omega-3 fatty acids 1000 MG capsule Take 2 capsules (2 g total) by mouth daily.  . Glucosamine-Chondroit-Vit C-Mn (GLUCOSAMINE 1500 COMPLEX) CAPS Take 2 capsules by mouth daily.  . meloxicam (MOBIC) 15 MG tablet Take 1 tablet (15 mg total) by mouth daily as needed for pain.  . simvastatin (ZOCOR) 40 MG tablet TAKE 1 TABLET BY MOUTH  EVERY NIGHT AT BEDTIME  . sodium chloride (OCEAN) 0.65 % SOLN nasal spray Place 1 spray into both nostrils as needed for congestion (every 1-2 hours as needed).  . vitamin B-12  (CYANOCOBALAMIN) 500 MCG tablet Take 1 tablet (500 mcg total) by mouth daily.  . vitamin C (ASCORBIC ACID) 500 MG tablet Take 2 tablets (1,000 mg total) by mouth daily.  . [DISCONTINUED] amoxicillin-clavulanate (AUGMENTIN) 875-125 MG tablet Take 1 tablet by mouth 2 (two) times daily.  . [DISCONTINUED] aspirin 81 MG tablet Take 81 mg by mouth daily. HOLD  . [DISCONTINUED] bifidobacterium infantis (ALIGN) capsule Take 1 capsule by mouth daily.  . [DISCONTINUED] meloxicam (MOBIC) 15 MG tablet TAKE 1 TABLET(15 MG) BY MOUTH DAILY AS NEEDED FOR PAIN  . [DISCONTINUED] Omega-3 Fatty Acids (FISH OIL) 1000 MG CAPS Take 2 capsules by mouth daily  . [EXPIRED] Tdap (BOOSTRIX) injection 0.5 mL    No facility-administered encounter medications on file as of 09/27/2017.     No Known Allergies   Immunization History  Administered Date(s) Administered  . Influenza Split 08/04/2011, 08/23/2012, 08/21/2013  . Influenza,inj,Quad PF,6+ Mos 09/08/2014, 09/09/2015, 09/08/2016, 09/27/2017  . Tdap 09/27/2017    Current Medications, Allergies, Past Medical History, Past Surgical History, Family History, and Social History were reviewed in [employer] record.    Review of Systems    The patient denies fever, chills, sweats, anorexia, fatigue, weakness, malaise, weight loss, sleep disorder, blurring, diplopia, eye irritation, eye discharge, vision loss, eye pain, photophobia, earache, ear discharge, tinnitus, decreased hearing, nasal congestion, nosebleeds, sore throat, hoarseness, chest pain, palpitations, syncope, dyspnea on exertion, orthopnea, PND, peripheral edema, cough, dyspnea at rest, excessive sputum, hemoptysis,  wheezing, pleurisy, nausea, vomiting, diarrhea, constipation, change in bowel habits, abdominal pain, melena, hematochezia, jaundice, gas/bloating, indigestion/heartburn, dysphagia, odynophagia, dysuria, hematuria, urinary frequency, urinary hesitancy, nocturia, incontinence,  back pain, joint pain, joint swelling, muscle cramps, muscle weakness, stiffness, arthritis, sciatica, restless legs, leg pain at night, leg pain with exertion, rash, itching, dryness, suspicious lesions, paralysis, paresthesias, seizures, tremors, vertigo, transient blindness, frequent falls, frequent headaches, difficulty walking, depression, anxiety, memory loss, confusion, cold intolerance, heat intolerance, polydipsia, polyphagia, polyuria, unusual weight change, abnormal bruising, bleeding, enlarged lymph nodes, urticaria, allergic rash, hay fever, and recurrent infections.     Objective:   Physical Exam     WD, WN, 61 y/o WM in NAD... GENERAL:  Alert & oriented; pleasant & cooperative... HEENT:  [place]/AT, EOM-wnl, PERRLA, Fundi-benign, EACs-clear, TMs-wnl, NOSE-clear, THROAT-clear & wnl. NECK:  Supple w/ full ROM; no JVD; normal carotid impulses w/o bruits; no thyromegaly or nodules palpated; no lymphadenopathy. CHEST:  Clear to P & A; without wheezes/ rales/ or rhonchi. HEART:  Regular Rhythm; without murmurs/ rubs/ or gallops. ABDOMEN:  Soft & nontender; normal bowel sounds; no organomegaly or masses detected. RECTAL:  Neg - prostate 2+ & nontender w/o nodules; stool hematest neg. EXT: without deformities +arthritic changes R>L knee; no varicose veins/ venous insuffic/ or edema. s/p right rotator cuff surg... NEURO:  CN's intact; motor testing normal; sensory testing normal; gait normal & balance OK. DERM:  No lesions noted; no rash etc >> [facility]...  RADIOLOGY DATA:  Reviewed in the EPIC EMR & discussed w/ the patient...  LABORATORY DATA:  Reviewed in the EPIC EMR & discussed w/ the patient...   Assessment & Plan:    09/27/17>   Raiford NobleRick continues to do well, stable on Simva40, & uses Glucosamine/ Meloxicam; he is very active & motivated- continue same; REC Flu vaccine & due for TDaP...   OSA>  Stable on CPAP w/o issues; continue same...  Fam Hx CAD>  Aware, pt  remains asymptomatic, exercises regularly, doing well...  CHOL>  On Simva40 & FLP looks OK; continue same...  Impaired fasting gluc>  +FamHx & FBS=108, discussed diet exercise etc...  Hx Blood in semen>  This occurred 6/15, resolved spont, no recurrence... Hx sl atrophic testes but he denies Low-T symptoms and we have not pursued this diagnosis in the past... Hx vasectomy 20+ yrs ago w/o sequelae...  DJD>  S/p right rotator cuff surg 9/10 [place], and bilat knee pain w/ XRays and MRI confirming bilat knee arthritis R>L & meniscus tear + chondromalacia patella=> he had right knee athroscopy 9/16 by DrMartin.  Other medical problems as listed...     Medication List        Accurate as of 09/27/17  4:21 PM. Always use your most recent med list.          cholecalciferol 1000 units tablet Commonly known as:  VITAMIN D Take 1 tablet (1,000 Units total) by mouth daily.   fish oil-omega-3 fatty acids 1000 MG capsule Take 2 capsules (2 g total) by mouth daily.   GLUCOSAMINE 1500 COMPLEX Caps Take 2 capsules by mouth daily.   meloxicam 15 MG tablet Commonly known as:  MOBIC Take 1 tablet (15 mg total) by mouth daily as needed for pain.   simvastatin 40 MG tablet Commonly known as:  ZOCOR TAKE 1 TABLET BY MOUTH  EVERY NIGHT AT BEDTIME   sodium chloride 0.65 % Soln nasal spray Commonly known as:  OCEAN Place 1 spray into both nostrils  as needed for congestion (every 1-2 hours as needed).   vitamin B-12 500 MCG tablet Commonly known as:  CYANOCOBALAMIN Take 1 tablet (500 mcg total) by mouth daily.   vitamin C 500 MG tablet Commonly known as:  ASCORBIC ACID Take 2 tablets (1,000 mg total) by mouth daily.       Where to Get Your Medications    These medications were sent to [id number] - [place], [place] - [address] AT [facility] OF [address]  [address], [place] [address]   Phone:  [phone]   meloxicam 15 MG tablet

## 2018-08-14 ENCOUNTER — Other Ambulatory Visit: Payer: Self-pay | Admitting: Pulmonary Disease

## 2018-10-01 ENCOUNTER — Ambulatory Visit: Payer: 59 | Admitting: Pulmonary Disease

## 2018-10-03 ENCOUNTER — Ambulatory Visit (INDEPENDENT_AMBULATORY_CARE_PROVIDER_SITE_OTHER): Payer: 59

## 2018-10-03 ENCOUNTER — Ambulatory Visit (INDEPENDENT_AMBULATORY_CARE_PROVIDER_SITE_OTHER)
Admission: RE | Admit: 2018-10-03 | Discharge: 2018-10-03 | Disposition: A | Discharge status: Home / Self Care | Payer: 59 | Source: Ambulatory Visit | Attending: Pulmonary Disease | Admitting: Pulmonary Disease

## 2018-10-03 ENCOUNTER — Encounter: Payer: Self-pay | Admitting: Pulmonary Disease

## 2018-10-03 ENCOUNTER — Ambulatory Visit (INDEPENDENT_AMBULATORY_CARE_PROVIDER_SITE_OTHER): Payer: 59 | Admitting: Pulmonary Disease

## 2018-10-03 VITALS — BP 118/72 | HR 77 | Temp 97.4°F | Ht 70.5 in | Wt 199.0 lb

## 2018-10-03 DIAGNOSIS — Z0000 Encounter for general adult medical examination without abnormal findings: Secondary | ICD-10-CM

## 2018-10-03 DIAGNOSIS — M170 Bilateral primary osteoarthritis of knee: Secondary | ICD-10-CM

## 2018-10-03 DIAGNOSIS — Z23 Encounter for immunization: Secondary | ICD-10-CM

## 2018-10-03 DIAGNOSIS — K429 Umbilical hernia without obstruction or gangrene: Secondary | ICD-10-CM | POA: Diagnosis not present

## 2018-10-03 DIAGNOSIS — R7301 Impaired fasting glucose: Secondary | ICD-10-CM

## 2018-10-03 DIAGNOSIS — G4733 Obstructive sleep apnea (adult) (pediatric): Secondary | ICD-10-CM

## 2018-10-03 DIAGNOSIS — E7800 Pure hypercholesterolemia, unspecified: Secondary | ICD-10-CM | POA: Diagnosis not present

## 2018-10-03 LAB — CBC WITH DIFFERENTIAL/PLATELET
BASOS ABS: 0 10*3/uL (ref 0.0–0.1)
Basophils Relative: 0.4 % (ref 0.0–3.0)
Eosinophils Absolute: 0.1 10*3/uL (ref 0.0–0.7)
Eosinophils Relative: 2.2 % (ref 0.0–5.0)
HCT: 44.5 % (ref 39.0–52.0)
Hemoglobin: 15.2 g/dL (ref 13.0–17.0)
Lymphocytes Relative: 22.4 % (ref 12.0–46.0)
Lymphs Abs: 1.3 10*3/uL (ref 0.7–4.0)
MCHC: 34.1 g/dL (ref 30.0–36.0)
MCV: 92 fl (ref 78.0–100.0)
Monocytes Absolute: 0.5 10*3/uL (ref 0.1–1.0)
Monocytes Relative: 7.8 % (ref 3.0–12.0)
NEUTROS ABS: 4 10*3/uL (ref 1.4–7.7)
Neutrophils Relative %: 67.2 % (ref 43.0–77.0)
PLATELETS: 251 10*3/uL (ref 150.0–400.0)
RBC: 4.84 Mil/uL (ref 4.22–5.81)
RDW: 12.7 % (ref 11.5–15.5)
WBC: 6 10*3/uL (ref 4.0–10.5)

## 2018-10-03 LAB — COMPREHENSIVE METABOLIC PANEL
ALT: 27 U/L (ref 0–53)
AST: 17 U/L (ref 0–37)
Albumin: 4.3 g/dL (ref 3.5–5.2)
Alkaline Phosphatase: 56 U/L (ref 39–117)
BUN: 19 mg/dL (ref 6–23)
CO2: 28 meq/L (ref 19–32)
Calcium: 9.4 mg/dL (ref 8.4–10.5)
Chloride: 104 mEq/L (ref 96–112)
Creatinine, Ser: 1.02 mg/dL (ref 0.40–1.50)
GFR: 78.8 mL/min (ref >60.00)
Glucose, Bld: 110 mg/dL — ABNORMAL HIGH (ref 70–99)
Potassium: 4.4 mEq/L (ref 3.5–5.1)
Sodium: 139 mEq/L (ref 135–145)
Total Bilirubin: 0.8 mg/dL (ref 0.2–1.2)
Total Protein: 6.7 g/dL (ref 6.0–8.3)

## 2018-10-03 LAB — LIPID PANEL
CHOL/HDL RATIO: 3
Cholesterol: 180 mg/dL (ref 0–200)
HDL: 51.5 mg/dL (ref >39.00)
LDL Cholesterol: 93 mg/dL (ref 0–99)
NonHDL: 128.68
Triglycerides: 179 mg/dL — ABNORMAL HIGH (ref 0.0–149.0)
VLDL: 35.8 mg/dL (ref 0.0–40.0)

## 2018-10-03 LAB — TSH: TSH: 0.72 u[IU]/mL (ref 0.35–4.50)

## 2018-10-03 LAB — PSA: PSA: 1.83 ng/mL (ref 0.10–4.00)

## 2018-10-03 MED ORDER — SIMVASTATIN 40 MG PO TABS: 40.0000 mg | ORAL_TABLET | Freq: Every day | ORAL | 3 refills | Status: DC

## 2018-10-03 MED ORDER — MELOXICAM 15 MG PO TABS: 15.0000 mg | ORAL_TABLET | Freq: Every day | ORAL | 3 refills | Status: DC | PRN

## 2018-10-03 NOTE — Patient Instructions (Signed)
Today we updated your med list in our EPIC system...    Continue your current medications the same...    We refilled your meds per request...  Today we did your follow up CXR, EKG, and FASTING blood work...    We will contact you w/ the results when available...   We also gave you the 2019 FLU vaccine and the 1st of the pneumonia shots= [id number] (one & done)...  We discussed my up-coming retirement & the need to procure a new primary care physician...  It has been my great honor to have been one of your doctors over these last many years!    Wishing you good health & much happiness in the years to come!!!

## 2018-10-08 ENCOUNTER — Encounter: Payer: Self-pay | Admitting: Pulmonary Disease

## 2018-10-08 NOTE — Progress Notes (Signed)
Subjective:    Patient ID: Daniel Carpenter, male    DOB: 07-16-1956, 62 y.o.   MRN: [id number]  HPI 62 y/o WM here for a follow up visit and CPX...  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  We reviewed the following medical problems during today's office visit>>     OSA> see below- stable on CPAP at 6cm pressure, uses regularly, rests well, no issues...    Pos FamHx CAD> both parents had CAD & CABG's, both died in their early 62's; he had neg Myoview 2004; he remains asymptomatic & we reviewed risk factor reduction strategy; he will also incr his exercise program...    Hyperlipid> on Simva40; FLP shows TChol 167, TG 104, HDL 46, LDL 101    BorderlineBS> on diet alone; Labs showed BS= 108 and he knows to avoid sweets etc...    He is up-to-date on colonoscopy screening> done 2010 by DrWood [place] & reported wnl- no polyps, divertics, etc...    Exam has noted atrophic testes> he is asymptomatic, feels well, good energy, libido ok, no performance issues, etc...     DJD> he has had some knee pain & saw an Ortho at [facility]; he reports improved...    Hx Actinic Keratoses> followed at [facility]... We reviewed prob list, meds, xrays and labs> see below for updates >> ok Flu shot today...  CXR 10/13 showed normal heart size, clear lungs, WNL.Marland KitchenMarland Kitchen  EKG 10/13 showed SBrady, rate 59, WNL, NAD...  LABS 10/13:  FLP- at goal on Simva40;  Chems- ok x BS=108;  CBC- wnl;  TSH=0.73;  PSA=1.13;  UA- clear...  ~  August 29, 2013:  Yearly ROV & CPX> Daniel Carpenter's only complaint is pain in his thumbs bilat, tender MCPs, and we discussed Mobic15, hot soaks, refer to Ortho is not responding... We reviewed the following medical problems during today's office visit >>     OSA> see below- stable on CPAP at 6cm pressure, uses regularly,  rests well, no issues; he is due for an upgrade & we will contact [facility]...    Pos FamHx CAD> both parents had CAD & CABG's, both died in their early 72's; he had neg Myoview 2004; he remains asymptomatic & we reviewed risk factor reduction strategy; he will also incr his exercise program...    Hyperlipid> on Simva40; FLP 10/14 shows TChol 185, TG 115, HDL 52, LDL 110; Rec same med, better diet & exercise...    BorderlineBS> on diet alone; Labs 10/14 showed BS= 116 and he knows to avoid sweets etc...    He is up-to-date on colonoscopy screening> done 2010 by DrWood [place] & reported wnl- no polyps, divertics, etc...    Exam has noted atrophic testes> he is asymptomatic, feels well, good energy, libido ok, no performance issues, etc...     DJD> on Glucosamine; he has had some knee pain & saw an Ortho at [facility]; he reports improved; we wrote for Mobic15 to try for thumbs etc...    Hx Actinic Keratoses> followed at [facility]... We reviewed prob list, meds, xrays and labs> see below for updates >> he takes several Vits & supplements... He does Yoga work-out 2d/wk...  LABS 10/14:  FLP- ok x LDL=110 on Simva40;  Chems- ok x BS=116;  CBC- wnl;  TSH=0.65;  PSA=0.95...  ~  April 21, 2014:  Add-on appt due to epis of blood in semen> he has seen this over 2 occasions in the last few weeks; denies blood in urine, no pain, no drainage, no f/c/s... He had a vasectomy 20+ yrs ago, no known sequellae... He is on ASA daily for primary prophylaxis... Prev exams have revealed atrophic testes> he is asymptomatic, feels well, good energy, libido ok, no performance issues, etc... Urinalysis is clear... Rec to HOLD his ASA & we will refer to Urology for reassurance & any further eval if needed...   ~  September 08, 2014:  Yearly CPX>  Daniel Carpenter reports doing well- no new complaints or concerns; prev hematospermia resolved spont w/o recurrence- he saw Urology, DrHerrick, who stressed that the prob is benign & rec considering finasteride  rx if needed for recurrent symptoms down the road... 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 We reviewed the following medical problems during today's office visit >>     OSA> see below- stable on CPAP at 6cm pressure, uses regularly, rests well, no issues; he has kept up w/ mask change and machine upgrades...     Pos FamHx CAD> both parents had CAD & CABG's, both died in their early 3370's; he had neg Myoview 2004; on ASA; he remains asymptomatic & we reviewed risk factor reduction strategy; he does Yoga w/ wife & it's a good workout.    Hyperlipid> on Simva40; FLP 11/15 shows TChol 143, TG 76, HDL 47, LDL 81; Rec same med, diet & exercise...    Impaired fasting gluc> on diet alone; Labs 11/15 showed BS= 106 and he knows to avoid sweets etc...    He is up-to-date on colonoscopy screening> done 2010 by DrWood in [place] & reported wnl- no polyps, divertics, etc...    Exam has noted atrophic testes> he is asymptomatic, feels well, good energy, libido ok, no performance issues, etc...     Episode of hematospermia> this occurred 6/15 & resolved spont; he saw Urology DrHerrick who confirmed benign etiology & rec considering finasteride if it was to recur...    DJD> on Glucosamine; he has had some knee pain & [facility]; he reports improved; we wrote for Mobic15 to try for thumbs etc...    Hx Act[facility] Keratoses> [facility]... We reviewed prob list, meds, xrays and labs> see below for updates >>   CXR 11/15 showed norm heart size, clear lungs, NAD.Marland Kitchen.Marland Kitchen.  EKG 11/15 showed SBrady, rate46, rsr' in V1, otherw wnl...  LABS 11/15:  FLP- at goals on Simva40;  Chems- ok x BS=106;  CBC- wnl;  TSH=0.83;  PSA=0.92;  UA- clear, no blood or infection...   ~  September 09, 2015:  Yearly ROV & CPX>  Daniel BurdockRichard reports a good yr overall- he has bilat knee DJD & [facility] ("he trimmed the meniscus") and imroved since  then; he has no other complaints or concerns... we reviewed the following medical problems during today's office visit >>     OSA> see below- stable on CPAP at 6cm pressure, uses regularly, rests well, no issues; he has kept up w/ mask change and machine upgrades...     Pos FamHx CAD> both parents had CAD & CABG's, both died in their early 7270's; he had neg Myoview 2004; on ASA; he remains asymptomatic & we  reviewed risk factor reduction strategy; he does Yoga w/ wife & it's a good workout- no CP/ palpit/ dizzy/ SOB/ edema...    Hyperlipid> on Simva40; FLP 11/16 shows TChol 182, TG 69, HDL 50, LDL 118; Rec- same med, better diet & resume ercise...    Impaired fasting gluc> on diet alone; Labs 11/16 showed BS= 111 and he knows to avoid sweets etc...    He is up-to-date on colonoscopy screening> done 2010 by DrWood in [place] & reported wnl- no polyps, divertics, & f/u planned 10 yrs...    Exam has noted atrophic testes> he is asymptomatic, feels well, good energy, libido ok, no performance issues, etc...     Episode of hematospermia> this occurred 6/15 & resolved spont; he saw Urology DrHerrick who confirmed benign etiology & rec considering Finasteride if it was to recur...    DJD- known bilat knee arthritis, s/p right knee arthroscopy 9/16 by [facility]> on Glucosamine & Mobic15; he has bilat knee pain (R>L) & [facility]- s/p Right arthroscopy 9/16 & he reports improved...     Hx Actinic Keratoses> followed at [facility]... EXAM shows Afeb, VSS, O2sat=99% on RA;  HEENT- neg, mallamapti2;  Chest- clear w/o w/r/r;  Heart- RR w/o m/r/g;  Abd- soft, nontender, neg;  Ext- neg w/o c/c/e;  Neuro- intact...  LABS 09/09/15>  FLP- on Simva40 but LDL=118;  Chems- ok x FBS=111;  CBC- wnl;  TSH=0.51;  PSA=1.15... IMP/PLAN>>  Jamiah is stable- s/p right knee arthroscopy & getting back into his exerc program; his FLP & FBS are not quite at goals and we reviewed diet & exercise prescription; Rec to continue  same meds...   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 We reviewed the following medical problems during today's office visit >>     OSA> see below- stable on CPAP at 6cm pressure, uses regularly, rests well, no issues; he has kept up w/ mask change (pillows) and machine upgrades...     Pos FamHx CAD> both parents had CAD & CABG's, both died in their early 28's; he had neg Myoview 2004; on ASA & vit supplements; he remains asymptomatic & we reviewed risk factor reduction strategy; he does Yoga w/ wife & it's a good workout- no CP/ palpit/ dizzy/ SOB/ edema...    Hyperlipid> on Simva40; FLP 11/17 shows TChol 158, TG 120, HDL 54, LDL 80; Rec- same med, diet & exercise...    Impaired fasting gluc> on diet alone; Labs 11/17 showed BS= 108 and he knows to avoid sweets etc...    He is up-to-date on colonoscopy screening> done [place] DrWood [place] & reported wnl- no polyps, divertics, & f/u planned 10 yrs...    Exam has noted atrophic testes> he is asymptomatic, feels well, good energy, libido ok, no performance issues, etc...     Episode of hematospermia> this occurred 6/15 & resolved spont; he saw Urology DrHerrick who confirmed benign etiology & rec considering Finasteride if it was to recur...    DJD- known bilat knee arthritis, s/p right knee arthroscopy 9/16 by DrMartin [facility]> on Glucosamine & Mobic15; he has bilat knee pain (R>L) & saw Ortho-DrMartin at [place]- s/p Right arthroscopy 9/16 & he reports improved...     Hx Actinic Keratoses> followed  at [facility]... EXAM shows Afeb, VSS, O2sat=97% on RA;  HEENT- neg, mallamapti2;  Chest- clear w/o w/r/r;  Heart- RR w/o m/r/g;  Abd- soft, nontender, neg;  Ext- neg w/o c/c/e;  Neuro- intact...  CXR 09/08/16 (independently reviewed by me in the PACS system) showed norm heart  size, clear lungs- NAD, mild scoliosis & DJD Tspine...  EKG 09/08/16>  SBrady, rate 43, otherw wnl... He denies dizzy/ lightheaded/ syncope/ CP/ palpait/ etc...  LABS 09/08/16>  FLP- all parameters at goals on Simva40;  Chems- wnl w/ BS=108, Cr=0.95, LFTs wnl;  CBC- wnl w/ Hg=15.8;  TSH=0.90;  PSA=1.26;  UA- clear & wnl... IMP/PLAN>>  Gerad continues to do well & is asymptomatic; he has a resting sinus bradycardia; very active w/ yoga etc; Labs all look good & he will continue Simva40 & ASA...    ~  September 27, 2017:  27yr ROV & CPX>  Raiford Noble returns for a routine yearly CPX- reports doing well, no new complaints or concerns; his only prescription med is Simva40, he takes Glucosamine/chondroitin for his joints and we are prescribing Meloxicam15 prn arthritis pain... We reviewed the following medical problems during today's office visit >>     OSA> see below- stable on CPAP at 6cm pressure, uses regularly, rests well, no issues; he has kept up w/ mask change (pillows) and machine upgrades...     Pos FamHx CAD> both parents had CAD & CABG's, both died in their early 17's; he had neg Myoview 2004; on ASA & vit supplements; he remains asymptomatic & we reviewed risk factor reduction strategy; he does Yoga w/ wife & it's a good workout- no CP/ palpit/ dizzy/ SOB/ edema...    Hyperlipid> on Simva40; FLP 10/18 is stable w/ TChol 152, TG 87, HDL 53, LDL 82; Rec- same med, diet & exercise...    Impaired fasting gluc> on diet alone; Labs 11/17 showed BS= 108 and labs 10/18 shows FBS= 106; he knows to avoid sweets etc...    He is up-to-date on colonoscopy screening> done [place] by DrWood [place] & reported wnl- no polyps, divertics, & f/u planned 10 yrs...    Exam has noted atrophic testes> he is asymptomatic, feels well, good energy, libido ok, no performance issues, etc...     Episode of hematospermia> this occurred 6/15 & resolved spont; he saw Urology DrHerrick who confirmed benign etiology & rec considering  Finasteride if it was to recur...    DJD- known bilat knee arthritis, s/p right knee arthroscopy 9/16 by DrMartin [facility]> on Glucosamine & Mobic15; he has bilat knee pain (R>L) & saw Ortho-DrMartin at [facility]- s/p Right arthroscopy 9/16 & he reports improved...     Hx Actinic Keratoses> followed at [facility]... EXAM shows Afeb, VSS, O2sat=97% on RA;  HEENT- neg, mallamapti2;  Chest- clear w/o w/r/r;  Heart- RR w/o m/r/g;  Abd- soft, nontender, neg;  Ext- neg w/o c/c/e;  Neuro- intact...  LABS 08/30/17>  FLP- at goals on Simva40;  Chems- wnl w/ BS=106;  CBC- wnl w/ Hg=16.2;  TSH=0.78;  PSA=1.20 IMP/PLAN>>  Raiford Noble continues to do well, stable on Simva40, & uses Glucosamine/ Meloxicam; he is very active & motivated- continue same; REC Flu shot & due for Tdap...   ~  October 03, 2018:  60yr ROV & CPX>                Problem List:  PHYSICAL EXAMINATION (ICD-V70.0) - advised ASA 81mg /d... he had a neg FlexSig in 1997 by DrStark and  had full colon[place] 2010 [place] DrWoods [place]> neg, normal w/o divertics or polyps. ~  CXR 12/10 clear, WNL, post op right shoulder... ~  EKG 10/12 showed SBrady, rate 58, WNL.Marland Kitchen. ~  CXR 10/13 showed normal heart size, clear lungs, WNL... ~  EKG 10/13 showed SBrady, rate 59, WNL, NAD.Marland Kitchen. ~  CXR 11/15 showed norm heart size, clear lungs, NAD... ~  EKG 11/15 showed SBrady, rate46, rsr' in V1, otherw wnl... ~  CXR 11/17 showed norm heart size, clear lungs- NAD, mild scoliosis & DJD Tspine ~  EKG 11/17 showed SBrady, rate 43, otherw wnl.  OBSTRUCTIVE SLEEP APNEA (ICD-327.23) - Sleep Study 6/04 showed RDI= 10, with desat to 81%... symptoms improved w/ CPAP trial and set at 6cm pressure... ENT eval by DrShoemaker... ~  10/12 & 10/13:  he reports using CPAP nightly, nasal pillows, no difficulty reported. ~  10/14 & 11/15: he continues to do well, reports that he is resting well, should be due for an upgrade & we will contact [facility]... ~  11/16 & 11/17: he continue stable, doing satis & no  daytime sleepinesss; keeping up w/ mask changes etc...  Family Hx of CAD (ICD-414.00) - both father & mother died in their early 42's w/ heart disease and CABG's...  we discussed risk factor reduction and careful monitoring over time... ~  NuclearStressTest 10/04 was neg- no ischemia, no infarction, EF=61%... ~  10/12 - 10/14:  he remains asymptomatic- no CP, palpit, SOB, edema, etc... ~  11/15: he remains active doing vigorous yoga w/ his wife at classes in [place]... ~  11/16: he had right knee surg 07/2015 [facility] & is starting to incr his exercise once again...  HYPERLIPIDEMIA (ICD-272.4) - on SIMVASTATIN 40mg /d...  ~  FLP 8/08 showed TChol 212, TG 144, HDL 44, LDL 140... on diet alone- Rec= start Simva40. ~  FLP 7/09 (on Simva40) showed TChol 135, TG 79, HDL 42, LDL 78 ~  FLP 12/10 (on Simva40) showed TChol 173, TG 104, HDL 33, LDL 119 ~  FLP 10/11 showed TChol 131, TG 66, HDL 51, LDL 67 ~  FLP 10/12 on Simva40 showed TChol 152, TG 120, HDL 45, LDL 83 ~  FLP 10/13 on Simva40 showed TChol 167, TG 104, HDL 46, LDL 101 ~  FLP 10/14 on Simva40 showed TChol 185, TG 115, HDL 52, LDL 110... Rec- same med, better diet & exercise. ~  FLP 11/15 on Simva40 showed TChol 143, TG 76, HDL 47, LDL 81  ~  FLP 11/16 on Simva40 showed TChol 182, TG 69, HDL 50, LDL 118... We reviewed diet & exercise program. ~  FLP 11/17 on Simva40 showed TChol 158, TG 120, HDL 54, LDL 80; Rec- same med, diet & exercise...  IMPAIRED FASTING GLUCOSE - +fam hx w/ 1 sis passed away w/ DM, ?hrt dis... ~  labs 8/08 w/ FBS= 121... advised low carb diet etc... he reports home BS= 100-110 range. ~  labs 7/09 showed BS= 116 ~  labs 12/10 showed BS= 107 ~  labs 10/11 showed BS= 107 ~  Labs 10/12 showed BS= 111 ~  Labs 10/13 on diet alone (wt=202#) showed BS= 108 ~  Labs 10/14 on diet alone (wt is down 8# to 194#) showed BS= 116 ~  Labs 11/15 on diet alone (wt=201#) showed BS= 106...  ~  Labs 11/16 on diet alone  (wt=196#) showed BS= 111 ~  LABS 11/17 on diet alone (wt=198#) showed BS=108  GI >> He  is up-to-date on colonoscopy screening> done 2010 by DrWood [place] & reported wnl- no polyps, divertics, etc..   GU >> Hematospermia >> 2 episodes 6/15, otherw neg hx & exam, s/p vasectomy 20+yrs ago, urinalysis is neg, asked to HOLD ASA & we will refer to Urology... ~  He had a vasectomy >20 yrs ago... ~  He is also noted to have atrophic testes but is totally asymptomatic, w/o complaints...   DJD >> Notes some right knee problem & has seen Ortho in [place]; prev right rotator cuff surg 9/10 [place] as well... ~  10/14: c/o pain at MCPs of thumbs- rec for heat, Mobic15, refer to Ortho if not responding... 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  Hx of ACTINIC KERATOSIS (ICD-702.0) - he is followed by Derm at [facility]...  HEALTH MAINTENANCE: ~  GI:  He had Colonoscopy 2010 by DrWood [place]> neg w/o polyps, divertics, etc; rec f/u 7-31yrs... ~  GU:  No LTOS, DRE is benign  ~  Immuniz:  He gets the yearly Flu vaccines...   Past Surgical History:  Procedure Laterality Date  . NASAL SEPTUM SURGERY  1998  . ROTATOR CUFF REPAIR  2010   right    Outpatient Encounter Medications as of 10/03/2018  Medication Sig  . cholecalciferol (VITAMIN D) 1000 UNITS tablet Take 1 tablet (1,000 Units total) by mouth daily.  . fish oil-omega-3 fatty acids 1000 MG capsule Take 2 capsules (2 g total) by mouth daily.  . Glucosamine-Chondroit-Vit C-Mn (GLUCOSAMINE 1500 COMPLEX) CAPS Take 2 capsules by mouth daily.  . meloxicam (MOBIC) 15 MG tablet Take 1 tablet (15 mg total) by mouth daily as needed for pain.  . simvastatin (ZOCOR) 40 MG tablet Take 1 tablet (40 mg total) by mouth at bedtime.  . sodium chloride (OCEAN) 0.65 % SOLN nasal spray Place 1 spray into both nostrils as needed for  congestion (every 1-2 hours as needed).  . vitamin B-12 (CYANOCOBALAMIN) 500 MCG tablet Take 1 tablet (500 mcg total) by mouth daily.  . vitamin C (ASCORBIC ACID) 500 MG tablet Take 2 tablets (1,000 mg total) by mouth daily.  . [DISCONTINUED] meloxicam (MOBIC) 15 MG tablet Take 1 tablet (15 mg total) by mouth daily as needed for pain.  . [DISCONTINUED] simvastatin (ZOCOR) 40 MG tablet TAKE 1 TABLET BY MOUTH  EVERY NIGHT AT BEDTIME   No facility-administered encounter medications on file as of 10/03/2018.     No Known Allergies   Immunization History  Administered Date(s) Administered  . Influenza Split 08/04/2011, 08/23/2012, 08/21/2013  . Influenza,inj,Quad PF,6+ Mos 09/08/2014, 09/09/2015, 09/08/2016, 09/27/2017, 10/03/2018  . Pneumococcal Conjugate-13 10/03/2018  . Tdap 09/27/2017    Current Medications, Allergies, Past Medical History, Past Surgical History, Family History, and Social History were reviewed in [employer] record.    Review of Systems    The patient denies fever, chills, sweats, anorexia, fatigue, weakness, malaise, weight loss, sleep disorder, blurring, diplopia, eye irritation, eye discharge, vision loss, eye pain, photophobia, earache, ear discharge, tinnitus, decreased hearing, nasal congestion, nosebleeds, sore throat, hoarseness, chest pain, palpitations, syncope, dyspnea on exertion, orthopnea, PND, peripheral edema, cough, dyspnea at rest, excessive sputum, hemoptysis, wheezing, pleurisy, nausea, vomiting, diarrhea, constipation, change in bowel habits, abdominal pain, melena, hematochezia, jaundice, gas/bloating, indigestion/heartburn, dysphagia, odynophagia, dysuria, hematuria, urinary frequency, urinary hesitancy, nocturia, incontinence,  back pain, joint pain, joint swelling, muscle cramps, muscle weakness, stiffness, arthritis, sciatica, restless legs, leg pain at night, leg pain with exertion, rash, itching, dryness, suspicious lesions,  paralysis, paresthesias, seizures, tremors, vertigo, transient blindness, frequent falls, frequent headaches, difficulty walking, depression, anxiety, memory loss, confusion, cold intolerance, heat intolerance, polydipsia, polyphagia, polyuria, unusual weight change, abnormal bruising, bleeding, enlarged lymph nodes, urticaria, allergic rash, hay fever, and recurrent infections.     Objective:   Physical Exam     WD, WN, 62 y/o WM in NAD... GENERAL:  Alert & oriented; pleasant & cooperative... HEENT:  [place]/AT, EOM-wnl, PERRLA, Fundi-benign, EACs-clear, TMs-wnl, NOSE-clear, THROAT-clear & wnl. NECK:  Supple w/ full ROM; no JVD; normal carotid impulses w/o bruits; no thyromegaly or nodules palpated; no lymphadenopathy. CHEST:  Clear to P & A; without wheezes/ rales/ or rhonchi. HEART:  Regular Rhythm; without murmurs/ rubs/ or gallops. ABDOMEN:  Soft & nontender; normal bowel sounds; no organomegaly or masses detected. RECTAL:  Neg - prostate 2+ & nontender w/o nodules; stool hematest neg. EXT: without deformities +arthritic changes R>L knee; no varicose veins/ venous insuffic/ or edema. s/p right rotator cuff surg... NEURO:  CN's intact; motor testing normal; sensory testing normal; gait normal & balance OK. DERM:  No lesions noted; no rash etc >> followed by Derm at [facility]...  RADIOLOGY DATA:  Reviewed in the EPIC EMR & discussed w/ the patient...  LABORATORY DATA:  Reviewed in the EPIC EMR & discussed w/ the patient...   Assessment & Plan:    09/27/17>   Raiford Noble continues to do well, stable on Simva40, & uses Glucosamine/ Meloxicam; he is very active & motivated- continue same; REC Flu vaccine & due for TDaP...   OSA>  Stable on CPAP w/o issues; continue same...  Fam Hx CAD>  Aware, pt remains asymptomatic, exercises regularly, doing well...  CHOL>  On Simva40 & FLP looks OK; continue same...  Impaired fasting gluc>  +FamHx & FBS=108, discussed diet exercise etc...  Hx Blood in semen>   This occurred 6/15, resolved spont, no recurrence... Hx sl atrophic testes but he denies Low-T symptoms and we have not pursued this diagnosis in the past... Hx vasectomy 20+ yrs ago w/o sequelae...  DJD>  S/p right rotator cuff surg 9/10 [place], and bilat knee pain w/ XRays and MRI confirming bilat knee arthritis R>L & meniscus tear + chondromalacia patella=> he had right knee athroscopy 9/16 by DrMartin.  Other medical problems as listed...   Patient's Medications  New Prescriptions   No medications on file  Previous Medications   CHOLECALCIFEROL (VITAMIN D) 1000 UNITS TABLET    Take 1 tablet (1,000 Units total) by mouth daily.   FISH OIL-OMEGA-3 FATTY ACIDS 1000 MG CAPSULE    Take 2 capsules (2 g total) by mouth daily.   GLUCOSAMINE-CHONDROIT-VIT C-MN (GLUCOSAMINE 1500 COMPLEX) CAPS    Take 2 capsules by mouth daily.   SODIUM CHLORIDE (OCEAN) 0.65 % SOLN NASAL SPRAY    Place 1 spray into both nostrils as needed for congestion (every 1-2 hours as needed).   VITAMIN B-12 (CYANOCOBALAMIN) 500 MCG TABLET    Take 1 tablet (500 mcg total) by mouth daily.   VITAMIN C (ASCORBIC ACID) 500 MG TABLET    Take 2 tablets (1,000 mg total) by mouth daily.  Modified Medications   Modified Medication Previous Medication   MELOXICAM (MOBIC) 15 MG TABLET meloxicam (MOBIC) 15 MG tablet      Take 1 tablet (15 mg total) by mouth  daily as needed for pain.    Take 1 tablet (15 mg total) by mouth daily as needed for pain.   SIMVASTATIN (ZOCOR) 40 MG TABLET simvastatin (ZOCOR) 40 MG tablet      Take 1 tablet (40 mg total) by mouth at bedtime.    TAKE 1 TABLET BY MOUTH  EVERY NIGHT AT BEDTIME  Discontinued Medications   No medications on file

## 2019-01-16 ENCOUNTER — Encounter: Payer: Self-pay | Admitting: Osteopathic Medicine

## 2019-01-16 ENCOUNTER — Ambulatory Visit (INDEPENDENT_AMBULATORY_CARE_PROVIDER_SITE_OTHER): Payer: Managed Care, Other (non HMO) | Admitting: Osteopathic Medicine

## 2019-01-16 ENCOUNTER — Other Ambulatory Visit: Payer: Self-pay

## 2019-01-16 VITALS — BP 130/77 | HR 55 | Temp 97.9°F | Ht 71.0 in | Wt 199.7 lb

## 2019-01-16 DIAGNOSIS — Z0000 Encounter for general adult medical examination without abnormal findings: Secondary | ICD-10-CM | POA: Diagnosis not present

## 2019-01-16 DIAGNOSIS — E782 Mixed hyperlipidemia: Secondary | ICD-10-CM | POA: Diagnosis not present

## 2019-01-16 NOTE — Patient Instructions (Signed)
General Preventive Care  Most recent routine labs: Dec 2019, will repeat in a year.   Everyone should have blood pressure checked once per year. Goal: 130/80 or less for most people.   Tobacco: don't!   Alcohol: responsible moderation is ok for most adults - if you have concerns about your alcohol intake, please talk to me!   Exercise: as tolerated to reduce risk of cardiovascular disease and diabetes. Strength training will also prevent osteoporosis.   Mental health: if need for mental health care (medicines, counseling, other), or concerns about moods, please let me know!   Sexual health: if need for STD testing, or if concerns with libido/pain problems, please let me know!  Advanced Directive: Living Will and/or Healthcare Power of Attorney recommended for all adults, regardless of age or health.  Vaccines  Flu vaccine: recommended for almost everyone, every fall.   Shingles vaccine: Shingrix recommended after age 23. Let us know if you'd like the shot!   Pneumonia vaccines: per pulmonology recommendations   Tetanus booster: Tdap recommended every 10 years. Due 09/2027 Cancer screenings   Colon cancer screening: continue screening as recommended by gastroenterology.   Prostate cancer screening: PSA blood test around age 79-71  Lung cancer screening: not needed for non-smokers  Infection screenings . HIV, Gonorrhea/Chlamydia: screening as needed.  . Hepatitis C: recommended once for anyone born 57-1964 . TB: certain at-risk populations, or depending on work requirements and/or travel history Other . Bone Density Test: recommended for men at age 84, sooner depending on risk factors . Abdominal Aortic Aneurysm: screening not needed for non-smokers

## 2019-01-16 NOTE — Progress Notes (Signed)
HPI: Daniel Carpenter is a 63 y.o. male who  has a past medical history of Actinic keratosis, Diabetes mellitus, Hyperlipidemia, and OSA (obstructive sleep apnea).  he presents to [facility] today, 01/16/19,  for chief complaint of: New to establish Hyperlipidemia Aches and pains on occasion  Previous doctor retired, he's here to establish care.  No medical issues other than hyperlipidemia, athritis.    Past medical, surgical, social and family history reviewed:  Patient Active Problem List   Diagnosis Date Noted  . Umbilical hernia without obstruction and without gangrene 10/03/2018  . Hypercholesteremia 09/08/2016  . Osteoarthritis of knees, bilateral 09/08/2016  . Blood in semen 04/22/2014  . Physical exam, annual 08/29/2012  . Obstructive sleep apnea 05/16/2008  . ACTINIC KERATOSIS 05/16/2008  . Impaired fasting glucose 05/15/2008    Past Surgical History:  Procedure Laterality Date  . NASAL SEPTUM SURGERY  1998  . ROTATOR CUFF REPAIR  2010   right    Social History   Tobacco Use  . Smoking status: Never Smoker  . Smokeless tobacco: Never Used  Substance Use Topics  . Alcohol use: No    Alcohol/week: 0.0 standard drinks    Family History  Problem Relation Age of Onset  . Emphysema Father   . Heart disease Father   . Heart attack Mother   . Heart disease Mother   . Diabetes Sister      Current medication list and allergy/intolerance information reviewed:    Current Outpatient Medications  Medication Sig Dispense Refill  . cholecalciferol (VITAMIN D) 1000 UNITS tablet Take 1 tablet (1,000 Units total) by mouth daily. 90 tablet 3  . fish oil-omega-3 fatty acids 1000 MG capsule Take 2 capsules (2 g total) by mouth daily. 90 capsule 3  . Glucosamine-Chondroit-Vit C-Mn (GLUCOSAMINE 1500 COMPLEX) CAPS Take 2 capsules by mouth daily. 180 capsule 3  . meloxicam (MOBIC) 15 MG tablet Take 1 tablet (15 mg total) by mouth daily  as needed for pain. 90 tablet 3  . simvastatin (ZOCOR) 40 MG tablet Take 1 tablet (40 mg total) by mouth at bedtime. 90 tablet 3  . vitamin B-12 (CYANOCOBALAMIN) 500 MCG tablet Take 1 tablet (500 mcg total) by mouth daily. 90 tablet 3  . vitamin C (ASCORBIC ACID) 500 MG tablet Take 2 tablets (1,000 mg total) by mouth daily. 90 tablet 3  . sodium chloride (OCEAN) 0.65 % SOLN nasal spray Place 1 spray into both nostrils as needed for congestion (every 1-2 hours as needed). (Patient not taking: Reported on 01/16/2019) 1 Bottle 0   No current facility-administered medications for this visit.     No Known Allergies    Review of Systems:  Constitutional:  No  fever, no chills, No recent illness, No unintentional weight changes. No significant fatigue.   HEENT: No  headache, no vision change, no hearing change, No sore throat, No  sinus pressure  Cardiac: No  chest pain, No  pressure, No palpitations  Respiratory:  No  shortness of breath. No  Cough  Gastrointestinal: No  abdominal pain, No  nausea, No  vomiting,  No  blood in stool, No  diarrhea, No  constipation   Musculoskeletal: No new myalgia/arthralgia  Skin: No  Rash, No other wounds/concerning lesions  Genitourinary: No  incontinence, No  abnormal genital bleeding, No abnormal genital discharge  Hem/Onc: No  easy bruising/bleeding, No  abnormal lymph node  Endocrine: No cold intolerance,  No heat intolerance. No polyuria/polydipsia/polyphagia  Neurologic: No  weakness, No  dizziness  Psychiatric: No  concerns with depression, No  concerns with anxiety, No sleep problems, No mood problems  Exam:  BP 130/77 (BP Location: Left Arm, Patient Position: Sitting, Cuff Size: Normal)   Pulse (!) 55   Temp 97.9 F (36.6 C) (Oral)   Ht 5\' 11"  (1.803 m)   Wt 199 lb 11.2 oz (90.6 kg)   BMI 27.85 kg/m   Constitutional: VS see above. General Appearance: alert, well-developed, well-nourished, NAD  Eyes: Normal lids and conjunctive,  non-icteric sclera  Ears, Nose, Mouth, Throat: MMM, Normal external inspection ears/nares/mouth/lips/gums. TM normal bilaterally. Pharynx/tonsils no erythema, no exudate. Nasal mucosa normal.   Neck: No masses, trachea midline. No thyroid enlargement. No tenderness/mass appreciated. No lymphadenopathy  Respiratory: Normal respiratory effort. no wheeze, no rhonchi, no rales  Cardiovascular: S1/S2 normal, no murmur, no rub/gallop auscultated. RRR. No lower extremity edema.  Gastrointestinal: Nontender, no masses. No hepatomegaly, no splenomegaly. No hernia appreciated. Bowel sounds normal. Rectal exam deferred.   Musculoskeletal: Gait normal. No clubbing/cyanosis of digits.   Neurological: Normal balance/coordination. No tremor.   Skin: warm, dry, intact. No rash/ulcer. No concerning nevi or subq nodules on limited exam.    Psychiatric: Normal judgment/insight. Normal mood and affect. Oriented x3.      ASSESSMENT/PLAN: The primary encounter diagnosis was Annual physical exam. A diagnosis of Mixed hyperlipidemia was also pertinent to this visit.   Labs ordered for future visit. Annual physical / preventive care was NOT performed or billed today.        Visit summary with medication list and pertinent instructions was printed for patient to review. All questions at time of visit were answered - patient instructed to contact office with any additional concerns or updates. ER/RTC precautions were reviewed with the patient.   Note: Total time spent 20 minutes, greater than 50% of the visit was spent face-to-face counseling and coordinating care for the above diagnoses listed in assessment/plan.   Please note: voice recognition software was used to produce this document, and typos may escape review. Please contact Dr. Lyn Hollingshead for any needed clarifications.     Follow-up plan: Return in about 9 months (around 10/18/2019) for ANNUAL PHYSICAL - labs prior to visit .

## 2019-08-26 ENCOUNTER — Other Ambulatory Visit: Payer: Self-pay

## 2019-08-26 ENCOUNTER — Ambulatory Visit (INDEPENDENT_AMBULATORY_CARE_PROVIDER_SITE_OTHER): Payer: Managed Care, Other (non HMO) | Admitting: Osteopathic Medicine

## 2019-08-26 VITALS — Temp 97.2°F | Ht 71.0 in | Wt 199.0 lb

## 2019-08-26 DIAGNOSIS — Z23 Encounter for immunization: Secondary | ICD-10-CM

## 2019-08-26 NOTE — Progress Notes (Signed)
Patient presents to clinic for a drive by flu immunization. Patient denies any allergic reactions to the flu vaccine in the past, also denies any reaction toe eggs or latex, and has not had a fever in the last 48 hours. Patient tolerated injection well in left deltoid with no immediate complications.

## 2019-09-27 ENCOUNTER — Encounter: Payer: Self-pay | Admitting: Osteopathic Medicine

## 2019-10-13 ENCOUNTER — Encounter: Payer: Self-pay | Admitting: Osteopathic Medicine

## 2019-10-13 DIAGNOSIS — Z0000 Encounter for general adult medical examination without abnormal findings: Secondary | ICD-10-CM

## 2019-10-17 LAB — CBC WITH DIFFERENTIAL/PLATELET
Absolute Monocytes: 358 cells/uL (ref 200–950)
Basophils Absolute: 29 cells/uL (ref 0–200)
Basophils Relative: 0.6 %
Eosinophils Absolute: 147 cells/uL (ref 15–500)
Eosinophils Relative: 3 %
HCT: 49.9 % (ref 38.5–50.0)
Hemoglobin: 17 g/dL (ref 13.2–17.1)
Lymphs Abs: 1387 cells/uL (ref 850–3900)
MCH: 31.1 pg (ref 27.0–33.0)
MCHC: 34.1 g/dL (ref 32.0–36.0)
MCV: 91.4 fL (ref 80.0–100.0)
MPV: 9.9 fL (ref 7.5–12.5)
Monocytes Relative: 7.3 %
Neutro Abs: 2979 cells/uL (ref 1500–7800)
Neutrophils Relative %: 60.8 %
Platelets: 320 10*3/uL (ref 140–400)
RBC: 5.46 10*6/uL (ref 4.20–5.80)
RDW: 12 % (ref 11.0–15.0)
Total Lymphocyte: 28.3 %
WBC: 4.9 10*3/uL (ref 3.8–10.8)

## 2019-10-17 LAB — COMPLETE METABOLIC PANEL WITH GFR
AG Ratio: 1.8 (calc) (ref 1.0–2.5)
ALT: 23 U/L (ref 9–46)
AST: 18 U/L (ref 10–35)
Albumin: 4.6 g/dL (ref 3.6–5.1)
Alkaline phosphatase (APISO): 63 U/L (ref 35–144)
BUN: 18 mg/dL (ref 7–25)
CO2: 32 mmol/L (ref 20–32)
Calcium: 9.6 mg/dL (ref 8.6–10.3)
Chloride: 102 mmol/L (ref 98–110)
Creat: 1.04 mg/dL (ref 0.70–1.25)
GFR, Est African American: 89 mL/min/{1.73_m2} (ref >=60)
GFR, Est Non African American: 77 mL/min/{1.73_m2} (ref >=60)
Globulin: 2.6 g/dL (calc) (ref 1.9–3.7)
Glucose, Bld: 117 mg/dL — ABNORMAL HIGH (ref 65–99)
Potassium: 5 mmol/L (ref 3.5–5.3)
Sodium: 140 mmol/L (ref 135–146)
Total Bilirubin: 0.8 mg/dL (ref 0.2–1.2)
Total Protein: 7.2 g/dL (ref 6.1–8.1)

## 2019-10-17 LAB — PSA, TOTAL WITH REFLEX TO PSA, FREE: PSA, Total: 1.6 ng/mL (ref <=4.0)

## 2019-10-17 LAB — LIPID PANEL W/REFLEX DIRECT LDL
Cholesterol: 170 mg/dL (ref <200)
HDL: 63 mg/dL (ref >=40)
LDL Cholesterol (Calc): 87 mg/dL (calc)
Non-HDL Cholesterol (Calc): 107 mg/dL (calc) (ref <130)
Total CHOL/HDL Ratio: 2.7 (calc) (ref <5.0)
Triglycerides: 105 mg/dL (ref <150)

## 2019-10-21 ENCOUNTER — Ambulatory Visit (INDEPENDENT_AMBULATORY_CARE_PROVIDER_SITE_OTHER): Payer: Managed Care, Other (non HMO) | Admitting: Osteopathic Medicine

## 2019-10-21 ENCOUNTER — Encounter: Payer: Self-pay | Admitting: Osteopathic Medicine

## 2019-10-21 ENCOUNTER — Other Ambulatory Visit: Payer: Self-pay

## 2019-10-21 VITALS — BP 124/78 | HR 65 | Temp 99.9°F | Wt 194.1 lb

## 2019-10-21 DIAGNOSIS — G4733 Obstructive sleep apnea (adult) (pediatric): Secondary | ICD-10-CM

## 2019-10-21 DIAGNOSIS — R7301 Impaired fasting glucose: Secondary | ICD-10-CM

## 2019-10-21 DIAGNOSIS — Z0000 Encounter for general adult medical examination without abnormal findings: Secondary | ICD-10-CM | POA: Diagnosis not present

## 2019-10-21 DIAGNOSIS — E7800 Pure hypercholesterolemia, unspecified: Secondary | ICD-10-CM | POA: Diagnosis not present

## 2019-10-21 LAB — POCT GLYCOSYLATED HEMOGLOBIN (HGB A1C): Hemoglobin A1C: 5.7 % — AB (ref 4.0–5.6)

## 2019-10-21 MED ORDER — SIMVASTATIN 40 MG PO TABS: 40.0000 mg | ORAL_TABLET | Freq: Every day | ORAL | 3 refills | Status: DC

## 2019-10-21 NOTE — Patient Instructions (Addendum)
General Preventive Care  Most recent routine screening lipids/other labs: already ordered.   Everyone should have blood pressure checked once per year. Goal 130/80 or less.   Tobacco: don't!   Alcohol: responsible moderation is ok for most adults - if you have concerns about your alcohol intake, please talk to me!   Exercise: as tolerated to reduce risk of cardiovascular disease and diabetes. Strength training will also prevent osteoporosis.   Mental health: if need for mental health care (medicines, counseling, other), or concerns about moods, please let me know!   Sexual health: if need for STD testing, or if concerns with libido/pain problems, please let me know!  Advanced Directive: Living Will and/or Healthcare Power of Attorney recommended for all adults, regardless of age or health.  Vaccines  Flu vaccine: recommended for almost everyone, every fall.   Shingles vaccine: Shingrix recommended after age 26.   Pneumonia vaccines: Prevnar and Pneumovax recommended after age 35  Tetanus booster: Tdap recommended every 10 years. Due 2028 Cancer screenings   Colon cancer screening: due 2030  Prostate cancer screening: PSA blood test annually age 76-71  Lung cancer screening: not needed for non-smokers  Infection screenings . HIV: recommended screening at least once age 57-65, more often as needed.  . Gonorrhea/Chlamydia: screening as needed . Hepatitis C: recommended for anyone born 58-1964 . TB: certain at-risk populations, or depending on work requirements and/or travel history Other . Bone Density Test: recommended for men at age 29, sooner depending on risk factors . Abdominal Aortic Aneurysm: screening with ultrasound recommended once for men age 53-75 who have ever smoked

## 2019-10-21 NOTE — Progress Notes (Signed)
HPI: Daniel Carpenter is a 63 y.o. male who  has a past medical history of Actinic keratosis, Diabetes mellitus, Hyperlipidemia, and OSA (obstructive sleep apnea).  [facility], 10/21/19,  for chief complaint of: Annual Physical     Patient here for annual physical / wellness exam.  See preventive care reviewed as below.  Recent labs reviewed in detail with the patient.   elev fasting Glc, A1C was ordered but lab hadn't done this as of pt's arrival. A1C today in office 5.7  Additional concerns today include:  None!     Past medical, surgical, social and family history reviewed:  Patient Active Problem List   Diagnosis Date Noted  . Umbilical hernia without obstruction and without gangrene 10/03/2018  . Hypercholesteremia 09/08/2016  . Osteoarthritis of knees, bilateral 09/08/2016  . Blood in semen 04/22/2014  . Physical exam, annual 08/29/2012  . Obstructive sleep apnea 05/16/2008  . ACTINIC KERATOSIS 05/16/2008  . Impaired fasting glucose 05/15/2008    Past Surgical History:  Procedure Laterality Date  . NASAL SEPTUM SURGERY  1998  . ROTATOR CUFF REPAIR  2010   right  . VASECTOMY      Social History   Tobacco Use  . Smoking status: Never Smoker  . Smokeless tobacco: Never Used  Substance Use Topics  . Alcohol use: No    Alcohol/week: 0.0 standard drinks    Family History  Problem Relation Age of Onset  . Emphysema Father   . Heart disease Father   . Heart attack Mother   . Heart disease Mother   . Diabetes Sister      Current medication list and allergy/intolerance information reviewed:    Current Outpatient Medications  Medication Sig Dispense Refill  . cholecalciferol (VITAMIN D) 1000 UNITS tablet Take 1 tablet (1,000 Units total) by mouth daily. 90 tablet 3  . fish oil-omega-3 fatty acids 1000 MG capsule Take 2 capsules (2 g total) by mouth daily. 90 capsule 3  . Glucosamine-Chondroit-Vit  C-Mn (GLUCOSAMINE 1500 COMPLEX) CAPS Take 2 capsules by mouth daily. 180 capsule 3  . meloxicam (MOBIC) 15 MG tablet Take 1 tablet (15 mg total) by mouth daily as needed for pain. 90 tablet 3  . simvastatin (ZOCOR) 40 MG tablet Take 1 tablet (40 mg total) by mouth at bedtime. 90 tablet 3  . vitamin B-12 (CYANOCOBALAMIN) 500 MCG tablet Take 1 tablet (500 mcg total) by mouth daily. 90 tablet 3  . vitamin C (ASCORBIC ACID) 500 MG tablet Take 2 tablets (1,000 mg total) by mouth daily. 90 tablet 3   No current facility-administered medications for this visit.    No Known Allergies    Review of Systems:  Constitutional:  No  fever, no chills, No recent illness  HEENT: No  headache, no vision change, no hearing change, No sore throat, No  sinus pressure  Cardiac: No  chest pain, No  pressure, No palpitations, No  Orthopnea  Respiratory:  No  shortness of breath. No  Cough  Gastrointestinal: No  abdominal pain, No  nausea, No  vomiting,  No  blood in stool, No  diarrhea, No  constipation   Musculoskeletal: No new myalgia/arthralgia  Skin: No  Rash, +other wounds/concerning lesions  Genitourinary: No  incontinence, No  abnormal genital bleeding, No abnormal genital discharge  Hem/Onc: No  easy bruising/bleeding  Endocrine: No cold intolerance,  No heat intolerance. No polyuria/polydipsia/polyphagia   Neurologic: No  weakness, No  dizziness  Psychiatric: No  concerns with depression, No  concerns with anxiety, No sleep problems, No mood problems  Exam:  BP 124/78 (BP Location: Left Arm, Patient Position: Sitting, Cuff Size: Normal)   Pulse 65   Temp 99.9 F (37.7 C) (Oral)   Wt 194 lb 1.9 oz (88.1 kg)   BMI 27.07 kg/m   Constitutional: VS see above. General Appearance: alert, well-developed, well-nourished, NAD  Eyes: Normal lids and conjunctive, non-icteric sclera  Ears, Nose, Mouth, Throat: TM normal bilaterally.   Neck: No masses, trachea midline. No thyroid enlargement.  No tenderness/mass appreciated. No lymphadenopathy  Respiratory: Normal respiratory effort. no wheeze, no rhonchi, no rales  Cardiovascular: S1/S2 normal, no murmur, no rub/gallop auscultated. RRR. No lower extremity edema.  Gastrointestinal: Nontender, no masses. No hepatomegaly, no splenomegaly. No hernia appreciated.   Musculoskeletal: Gait normal. No clubbing/cyanosis of digits.   Neurological: Normal balance/coordination. No tremor.    Skin: warm, dry, intact. No rash/ulcer. No concerning nevi or subq nodules on limited exam.  Benign seborrheic keratoses scattered on back   Psychiatric: Normal judgment/insight. Normal mood and affect. Oriented x3.    No results found for this or any previous visit (from the past 72 hour(s)).  No results found.       ASSESSMENT/PLAN: The primary encounter diagnosis was Physical exam, annual. Diagnoses of Obstructive sleep apnea, Hypercholesteremia, and Impaired fasting glucose were also pertinent to this visit.   Orders Placed This Encounter  Procedures  . CBC  . COMPLETE METABOLIC PANEL WITH GFR  . LIPID SCREENING  . A1C  . PSA SOLSTAS  . Hemoglobin A1c  . POCT HgB A1C    Meds ordered this encounter  Medications  . simvastatin (ZOCOR) 40 MG tablet    Sig: Take 1 tablet (40 mg total) by mouth at bedtime.    Dispense:  90 tablet    Refill:  3   The 10-year ASCVD risk score Mikey Bussing DC Jr., et al., 2013) is: 7.2%   Values used to calculate the score:     Age: 58 years     Sex: Male     Is Non-Hispanic African American: No     Diabetic: No     Tobacco smoker: No     Systolic Blood Pressure: 644 mmHg     Is BP treated: No     HDL Cholesterol: 63 mg/dL     Total Cholesterol: 170 mg/dL  Patient Instructions  General Preventive Care  Most recent routine screening lipids/other labs: already ordered.   Everyone should have blood pressure checked once per year. Goal 130/80 or less.   Tobacco: don't!   Alcohol: responsible  moderation is ok for most adults - if you have concerns about your alcohol intake, please talk to me!   Exercise: as tolerated to reduce risk of cardiovascular disease and diabetes. Strength training will also prevent osteoporosis.   Mental health: if need for mental health care (medicines, counseling, other), or concerns about moods, please let me know!   Sexual health: if need for STD testing, or if concerns with libido/pain problems, please let me know!  Advanced Directive: Living Will and/or Healthcare Power of Attorney recommended for all adults, regardless of age or health.  Vaccines  Flu vaccine: recommended for almost everyone, every fall.   Shingles vaccine: Shingrix recommended after age 47.   Pneumonia vaccines: Prevnar and Pneumovax recommended after age 1  Tetanus booster: Tdap recommended every 10 years. Due 2028 Cancer screenings   Colon  cancer screening: due 2030  Prostate cancer screening: PSA blood test annually age 24-71  Lung cancer screening: not needed for non-smokers  Infection screenings . HIV: recommended screening at least once age 33-65, more often as needed.  . Gonorrhea/Chlamydia: screening as needed . Hepatitis C: recommended for anyone born 76-1964 . TB: certain at-risk populations, or depending on work requirements and/or travel history Other . Bone Density Test: recommended for men at age 48, sooner depending on risk factors . Abdominal Aortic Aneurysm: screening with ultrasound recommended once for men age 90-75 who have ever smoked         Visit summary with medication list and pertinent instructions was printed for patient to review. All questions at time of visit were answered - patient instructed to contact office with any additional concerns or updates. ER/RTC precautions were reviewed with the patient.     Please note: voice recognition software was used to produce this document, and typos may escape review. Please contact Dr.  Lyn Hollingshead for any needed clarifications.     Follow-up plan: Return for 08/2020 or 09/2020 ANNUAL (get labs prior to visit, orders are in). LAB VISIT ONLY check A1C 03/2020.

## 2019-10-26 ENCOUNTER — Other Ambulatory Visit: Payer: Self-pay | Admitting: Pulmonary Disease

## 2019-12-18 ENCOUNTER — Encounter: Payer: Self-pay | Admitting: Sports Medicine

## 2019-12-18 ENCOUNTER — Ambulatory Visit (INDEPENDENT_AMBULATORY_CARE_PROVIDER_SITE_OTHER): Payer: Managed Care, Other (non HMO) | Admitting: Sports Medicine

## 2019-12-18 DIAGNOSIS — M5136 Other intervertebral disc degeneration, lumbar region: Secondary | ICD-10-CM

## 2019-12-18 DIAGNOSIS — M5030 Other cervical disc degeneration, unspecified cervical region: Secondary | ICD-10-CM

## 2019-12-18 DIAGNOSIS — M51369 Other intervertebral disc degeneration, lumbar region without mention of lumbar back pain or lower extremity pain: Secondary | ICD-10-CM | POA: Insufficient documentation

## 2019-12-18 NOTE — Assessment & Plan Note (Signed)
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 Pain is axial and discogenic, no bowel or bladder dysfunction or saddle numbness. He had x-rays with a chiropractor and that showed multilevel cervical and lumbar DDD. At this point he seeks allopathic intervention. He already has meloxicam which she will start taking, adding formal physical therapy. As he has already failed greater than 6 weeks of physician based conservative care we are going to proceed with MRIs as well. Return to see me to go over MRI results. I did explain to him the developmental anthropology of the lumbar spine, lumbar DDD, and we discussed future treatments. 

## 2019-12-18 NOTE — Assessment & Plan Note (Signed)
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 Pain is axial and discogenic, no bowel or bladder dysfunction or saddle numbness. He had x-rays with a chiropractor and that showed multilevel cervical and lumbar DDD. At this point he seeks allopathic intervention. He already has meloxicam which she will start taking, adding formal physical therapy. As he has already failed greater than 6 weeks of physician based conservative care we are going to proceed with MRIs as well. Return to see me to go over MRI results. I did explain to him the developmental anthropology of the lumbar spine, lumbar DDD, and we discussed future treatments.

## 2019-12-18 NOTE — Progress Notes (Signed)
    Procedures performed today:    None.  Independent interpretation of tests performed by another provider:   I reviewed his cervical and lumbar spine x-rays from a chiropractor/outside facility, there is multilevel DDD of the cervical and lumbar spine.  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 Pain is axial and discogenic, no bowel or bladder dysfunction or saddle numbness. He had x-rays with a chiropractor and that showed multilevel cervical and lumbar DDD. At this point he seeks allopathic intervention. He already has meloxicam which she will start taking, adding formal physical therapy. As he has already failed greater than 6 weeks of physician based conservative care we are going to proceed with MRIs as well. Return to see me to go over MRI results. I did explain to him the developmental anthropology of the lumbar spine, lumbar DDD, and we discussed future treatments.  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 Pain is axial and discogenic, no bowel or bladder dysfunction or saddle numbness. He had x-rays with a chiropractor and that showed multilevel cervical and lumbar DDD. At this point he seeks allopathic intervention. He already has meloxicam which she will start taking, adding formal physical therapy. As he has already failed greater than 6 weeks of physician based conservative care we are going to proceed with MRIs as well. Return to see me to go over MRI results. I did explain to him the developmental anthropology of the lumbar spine, lumbar DDD, and we discussed future treatments.    ___________________________________________ [name]. Benjamin Stain, M.D., ABFM., CAQSM. Primary Care and Sports  Medicine [facility]  Adjunct Instructor of Family Medicine  [employer]

## 2019-12-20 DIAGNOSIS — M5136 Other intervertebral disc degeneration, lumbar region: Secondary | ICD-10-CM

## 2019-12-20 DIAGNOSIS — M5030 Other cervical disc degeneration, unspecified cervical region: Secondary | ICD-10-CM

## 2019-12-20 NOTE — Telephone Encounter (Signed)
T, patient has been calling Imaging relentlessly and is getting very frustrated this is not authorized. I have called [employer] and asked to expedite and they denied. They also would not let me talk to any nurse reviewers, stated they have to have the medical records to review.  Unfortunately, we do not have x-rays on this patient and I could not locate any through Care Everywhere. I feel these are going to be needed for approval. Can we order x-rays stat for patient to have done today? Not sure that we will get results back in time for me to fax to [employer], but it's worth a shot.   I pended orders for x-rays.Marland Kitchen

## 2019-12-20 NOTE — Telephone Encounter (Signed)
He did have x-rays, I mentioned it in my office note.  They were done at his chiropractor office.  He does not need more x-rays.

## 2019-12-22 ENCOUNTER — Ambulatory Visit (INDEPENDENT_AMBULATORY_CARE_PROVIDER_SITE_OTHER): Payer: Managed Care, Other (non HMO)

## 2019-12-22 ENCOUNTER — Other Ambulatory Visit: Payer: Self-pay

## 2019-12-22 DIAGNOSIS — M5030 Other cervical disc degeneration, unspecified cervical region: Secondary | ICD-10-CM | POA: Diagnosis not present

## 2019-12-22 DIAGNOSIS — M5136 Other intervertebral disc degeneration, lumbar region: Secondary | ICD-10-CM | POA: Diagnosis not present

## 2019-12-22 NOTE — Telephone Encounter (Signed)
Bravo to you for getting these approved!!!  I'm sure Mr. Coller will be happy too!!!

## 2019-12-24 ENCOUNTER — Ambulatory Visit (INDEPENDENT_AMBULATORY_CARE_PROVIDER_SITE_OTHER): Payer: Managed Care, Other (non HMO) | Admitting: Sports Medicine

## 2019-12-24 ENCOUNTER — Other Ambulatory Visit: Payer: Self-pay

## 2019-12-24 DIAGNOSIS — M5136 Other intervertebral disc degeneration, lumbar region: Secondary | ICD-10-CM | POA: Diagnosis not present

## 2019-12-24 DIAGNOSIS — M5030 Other cervical disc degeneration, unspecified cervical region: Secondary | ICD-10-CM

## 2019-12-24 DIAGNOSIS — M51369 Other intervertebral disc degeneration, lumbar region without mention of lumbar back pain or lower extremity pain: Secondary | ICD-10-CM

## 2019-12-24 NOTE — Telephone Encounter (Signed)
Patient scheduled.

## 2019-12-24 NOTE — Assessment & Plan Note (Signed)
Cervical spine right shows multilevel cervical DDD, worst from C4-C7, no evidence of spinal cord compression. We will continue with conservative treatment for 6 weeks before considering a cervical epidural.

## 2019-12-24 NOTE — Progress Notes (Signed)
    Procedures performed today:    None.  Independent interpretation of tests performed by another provider:   I personally reviewed the cervical and lumbar spine MRIs, that show multilevel cervical and lumbar DDD.  Impression and Recommendations:    DDD (degenerative disc disease), cervical Cervical spine right shows multilevel cervical DDD, worst from C4-C7, no evidence of spinal cord compression. We will continue with conservative treatment for 6 weeks before considering a cervical epidural.  Lumbar degenerative disc disease Lumbar spine MRI also shows multilevel degenerative changes with degenerative scoliosis, continue meloxicam, formal PT. If no better we will consider a lumbar epidural.    ___________________________________________ [name]. Benjamin Stain, M.D., ABFM., CAQSM. Primary Care and Sports Medicine [facility]  Adjunct Instructor of Family Medicine  [employer]

## 2019-12-24 NOTE — Assessment & Plan Note (Signed)
Lumbar spine MRI also shows multilevel degenerative changes with degenerative scoliosis, continue meloxicam, formal PT. If no better we will consider a lumbar epidural.

## 2019-12-26 ENCOUNTER — Other Ambulatory Visit: Payer: Self-pay

## 2019-12-26 ENCOUNTER — Telehealth: Payer: Self-pay | Admitting: Sports Medicine

## 2019-12-26 ENCOUNTER — Ambulatory Visit (INDEPENDENT_AMBULATORY_CARE_PROVIDER_SITE_OTHER): Payer: Managed Care, Other (non HMO) | Admitting: Physical Therapy

## 2019-12-26 DIAGNOSIS — M62830 Muscle spasm of back: Secondary | ICD-10-CM

## 2019-12-26 DIAGNOSIS — M6281 Muscle weakness (generalized): Secondary | ICD-10-CM | POA: Diagnosis not present

## 2019-12-26 DIAGNOSIS — M545 Low back pain, unspecified: Secondary | ICD-10-CM

## 2019-12-26 DIAGNOSIS — R293 Abnormal posture: Secondary | ICD-10-CM

## 2019-12-26 DIAGNOSIS — M51369 Other intervertebral disc degeneration, lumbar region without mention of lumbar back pain or lower extremity pain: Secondary | ICD-10-CM

## 2019-12-26 DIAGNOSIS — G8929 Other chronic pain: Secondary | ICD-10-CM

## 2019-12-26 DIAGNOSIS — M5136 Other intervertebral disc degeneration, lumbar region: Secondary | ICD-10-CM

## 2019-12-26 MED ORDER — TRAMADOL HCL 50 MG PO TABS: 50.0000 mg | ORAL_TABLET | Freq: Three times a day (TID) | ORAL | 0 refills | Status: DC | PRN

## 2019-12-26 NOTE — Therapy (Signed)
[place], [place], [address] Phone: [phone]   Fax:  [phone]  Physical Therapy Evaluation  Patient Details  Name: Daniel Carpenter MRN: [id number] Date of Birth: [birth year] Referring Provider (PT): Dr. Dianah Field   Encounter Date: 12/26/2019  PT End of Session - 12/26/19 1103    Visit Number  1    Number of Visits  12    Date for PT Re-Evaluation  02/06/20    Authorization Type  [employer]    PT Start Time  1104    PT Stop Time  1151    PT Time Calculation (min)  47 min    Activity Tolerance  Patient tolerated treatment well    Behavior During Therapy  [facility] for tasks assessed/performed       Past Medical History:  Diagnosis Date  . Actinic keratosis   . Diabetes mellitus    boderline  . Hyperlipidemia   . OSA (obstructive sleep apnea)     Past Surgical History:  Procedure Laterality Date  . NASAL SEPTUM SURGERY  1998  . ROTATOR CUFF REPAIR  2010   right  . VASECTOMY      There were no vitals filed for this visit.   Subjective Assessment - 12/26/19 1106    Subjective  Patient sits 90% of the time at work. Back pain has increased in the past year. He was adjusted at chiropractor one day and could not stand up after. The next day he was good after muscle relaxors. 12/16/19 he felt a twinge in right side. He laid down for about an hour and then coudn't stand up again. Meds and rest helped again and was better the next day. Stiffness upon standing. Patient avoiding sitting and using stand up desk now.    Pertinent History  arthritis, cervical and lumbar DDD, left knee surgery, RCR    Diagnostic tests  MRI    Patient Stated Goals  To stop back from spasming    Currently in Pain?  No/denies         [facility] PT Assessment - 12/26/19 0001      Assessment   Medical Diagnosis  lumbar spondylosis    Referring Provider (PT)  Dr. Dianah Field    Onset Date/Surgical Date  12/16/19    Hand  Dominance  Right    Next MD Visit  6 weeks    Prior Therapy  no      Precautions   Precautions  None      Restrictions   Weight Bearing Restrictions  No      Balance Screen   Has the patient fallen in the past 6 months  No    Has the patient had a decrease in activity level because of a fear of falling?   No    Is the patient reluctant to leave their home because of a fear of falling?   No      Home Film/video editor residence      Prior Function   Level of Independence  Independent    Vocation  Full time employment    Vocation Requirements  computer    Leisure  works on restoring cars      Observation/Other Assessments   Focus on Therapeutic Outcomes ([place])   42% limited   35% goal     Posture/Postural Control   Posture Comments  mild scoliosis; left paraspinal tightness, left lateral shift  ROM / Strength   AROM / PROM / Strength  AROM;Strength      AROM   Overall AROM Comments  lumbar flex/ext and rot WNL; left SB 25%, Rt 75% with some pain in righ lower back; Bil hips WNL      Strength   Overall Strength Comments  Poor lumbar stabilization with MMT of LEs; Bil hip flex 4+/5. left hip ABD 4/5, right 5/5, bil hip ext 5/5, Rt knee flex 4+/5, left 5/5      Flexibility   Soft Tissue Assessment /Muscle Length  yes    Hamstrings  WNL    Quadriceps  WNL    ITB  WNL    Piriformis  some tightness    Quadratus Lumborum  left tight      Palpation   Spinal mobility  Decreased PA mobility throughout lumbar spine    Palpation comment  left paraspinal tightness left and tenderness to left QL                Objective measurements completed on examination: See above findings.              PT Education - 12/26/19 1148    Education Details  HEP    Person(s) Educated  Patient    Methods  Explanation;Handout;Demonstration    Comprehension  Verbalized understanding;Returned demonstration          PT Long Term Goals -  12/26/19 1218      PT LONG TERM GOAL #1   Title  Ind with HEP for strength and flexibilty to prevent further injury    Time  6    Period  Weeks    Status  New    Target Date  02/06/20      PT LONG TERM GOAL #2   Title  Patient to report decreased stiffness upon standing by 75% or more.    Time  6    Period  Weeks    Status  New      PT LONG TERM GOAL #3   Title  Patient to demonstrate improved core stability with seated MMT and with therex to prevent further spasms.    Time  6    Period  Weeks    Status  New      PT LONG TERM GOAL #4   Title  Patient able to verbalize and demonstrate correct body mechanics to protect low back when working on his car.    Time  6    Period  Weeks    Status  New             Plan - 12/26/19 1209    Clinical Impression Statement  Patient presents with complaints of low back stiffness upon standing and recent incidences of back spasming preventing mobility. Meds and rest have helped. He has visible tightness in his left paraspinals and left QL creating a lateral shift. Additionally his lumbar spine mobility is decreased. Despite this his ROM is WFL except bil SB. Patient has some LE weakness and definite lumbar stabilization weakness. He will benefit from PT to decrease muscle tension and increase strength in his spinal stabilizers to prevent further injury.    Stability/Clinical Decision Making  Stable/Uncomplicated    Clinical Decision Making  Low    Rehab Potential  Excellent    PT Frequency  2x / week    PT Duration  6 weeks    PT Treatment/Interventions  ADLs/Self Care Home Management;Cryotherapy;Electrical Stimulation;Moist Heat;Traction;Therapeutic exercise;Therapeutic  activities;Neuromuscular re-education;Patient/family education;Dry needling;Taping;Spinal Manipulations    PT Next Visit Plan  review HEP; DN/STW to paraspinals/multifidi; lumbar stab    PT Home Exercise Plan  [id number]    Consulted and Agree with Plan of Care  Patient        Patient will benefit from skilled therapeutic intervention in order to improve the following deficits and impairments:  Decreased range of motion, Pain, Impaired flexibility, Decreased strength, Postural dysfunction, Increased muscle spasms  Visit Diagnosis: Muscle spasm of back - Plan: PT plan of care cert/re-cert  Muscle weakness (generalized) - Plan: PT plan of care cert/re-cert  Abnormal posture - Plan: PT plan of care cert/re-cert  Chronic bilateral low back pain without sciatica - Plan: PT plan of care cert/re-cert     Problem List Patient Active Problem List   Diagnosis Date Noted  . DDD (degenerative disc disease), cervical 12/18/2019  . Lumbar degenerative disc disease 12/18/2019  . Umbilical hernia without obstruction and without gangrene 10/03/2018  . Hypercholesteremia 09/08/2016  . Osteoarthritis of knees, bilateral 09/08/2016  . Blood in semen 04/22/2014  . Physical exam, annual 08/29/2012  . Obstructive sleep apnea 05/16/2008  . ACTINIC KERATOSIS 05/16/2008  . Impaired fasting glucose 05/15/2008    Solon Palm PT 12/26/2019, 12:30 PM  [facility] [address] [place], [place], [address] Phone: [phone]   Fax:  [phone]  Name: Daniel Carpenter MRN: [id number] Date of Birth: 05/01/1956

## 2019-12-26 NOTE — Telephone Encounter (Signed)
PT requested for a medication to be sent into his pharmacy.He has completed his physical therapy.   Tramadol.   Please advise.

## 2019-12-26 NOTE — Telephone Encounter (Signed)
Done

## 2019-12-26 NOTE — Patient Instructions (Signed)
Access Code: [email or web address]  URL: [email or web address]  Date: 12/26/2019  Prepared by: Raynelle Fanning Javaun Dimperio   Exercises Seated Quadratus Lumborum Stretch with Arm Overhead - 1 sets                   - 60 sec hold - 2x daily - 7x weekly Child's Pose with Sidebending - 2 reps - 1 sets - 60 sec hold - 2x daily - 7x weekly Patient Education Trigger Point Dry Needling

## 2019-12-26 NOTE — Addendum Note (Signed)
Addended by: Monica Becton on: 12/26/2019 12:13 PM   Modules accepted: Orders

## 2019-12-26 NOTE — Telephone Encounter (Signed)
Pt notified of rx. 

## 2019-12-30 ENCOUNTER — Ambulatory Visit (INDEPENDENT_AMBULATORY_CARE_PROVIDER_SITE_OTHER): Payer: Managed Care, Other (non HMO) | Admitting: Rehabilitative and Restorative Service Providers"

## 2019-12-30 ENCOUNTER — Other Ambulatory Visit: Payer: Self-pay

## 2019-12-30 DIAGNOSIS — G8929 Other chronic pain: Secondary | ICD-10-CM

## 2019-12-30 DIAGNOSIS — R293 Abnormal posture: Secondary | ICD-10-CM

## 2019-12-30 DIAGNOSIS — M62830 Muscle spasm of back: Secondary | ICD-10-CM

## 2019-12-30 DIAGNOSIS — M6281 Muscle weakness (generalized): Secondary | ICD-10-CM

## 2019-12-30 DIAGNOSIS — M545 Low back pain: Secondary | ICD-10-CM | POA: Diagnosis not present

## 2019-12-30 NOTE — Therapy (Signed)
[facility] [address] [place], [place], [address] Phone: [phone]   Fax:  [phone]  Physical Therapy Treatment  Patient Details  Name: Daniel Carpenter MRN: [id number] Date of Birth: [birth year] Referring Provider (PT): Dr. Benjamin Stain   Encounter Date: 12/30/2019  PT End of Session - 12/30/19 0809    Visit Number  2    Number of Visits  12    Date for PT Re-Evaluation  02/06/20    PT Start Time  0804    PT Stop Time  0847    PT Time Calculation (min)  43 min    Activity Tolerance  Patient tolerated treatment well       Past Medical History:  Diagnosis Date  . Actinic keratosis   . Diabetes mellitus    boderline  . Hyperlipidemia   . OSA (obstructive sleep apnea)     Past Surgical History:  Procedure Laterality Date  . NASAL SEPTUM SURGERY  1998  . ROTATOR CUFF REPAIR  2010   right  . VASECTOMY      There were no vitals filed for this visit.  Subjective Assessment - 12/30/19 0810    Subjective  Stretching is helping. Has done his exercises at home. Ready to try the DN. Pain is random - has occured twice in the last two months - lasting ~ 24 hours. Very stiff in LB post treatment. Encouraged patient to use moist heat and continue with gentle exercises    Currently in Pain?  No/denies                       [facility] Adult PT Treatment/Exercise - 12/30/19 0001      Lumbar Exercises: Stretches   Press Ups  5 reps   2-3 sec hold      Lumbar Exercises: Quadruped   Madcat/Old Horse  10 reps    Madcat/Old Horse Limitations  difficulty with sag     Plank  modified plank on wall or counter 30 sec x 2 reps     Other Quadruped Lumbar Exercises  lateral trunk flexion in child's pose 20-30 sec x 3 reps each side       Moist Heat Therapy   Number Minutes Moist Heat  10 Minutes    Moist Heat Location  Lumbar Spine      Manual Therapy   Manual Therapy  Soft tissue mobilization    Manual  therapy comments  skilled palpation of tissue for DN     Soft tissue mobilization  deep tissue work through bilat lumbar musculature        Trigger Point Dry Needling - 12/30/19 0001    Consent Given?  Yes    Education Handout Provided  Yes    Other Dry Needling  bilat     Erector spinae Response  Palpable increased muscle length    Lumbar multifidi Response  Palpable increased muscle length    Quadratus Lumborum Response  Palpable increased muscle length           PT Education - 12/30/19 0844    Education Details  DN HEP    Person(s) Educated  Patient    Methods  Explanation;Demonstration;Tactile cues;Verbal cues;Handout    Comprehension  Verbalized understanding;Returned demonstration;Verbal cues required;Tactile cues required          PT Long Term Goals - 12/26/19 1218      PT LONG TERM GOAL #1   Title  Ind with  HEP for strength and flexibilty to prevent further injury    Time  6    Period  Weeks    Status  New    Target Date  02/06/20      PT LONG TERM GOAL #2   Title  Patient to report decreased stiffness upon standing by 75% or more.    Time  6    Period  Weeks    Status  New      PT LONG TERM GOAL #3   Title  Patient to demonstrate improved core stability with seated MMT and with therex to prevent further spasms.    Time  6    Period  Weeks    Status  New      PT LONG TERM GOAL #4   Title  Patient able to verbalize and demonstrate correct body mechanics to protect low back when working on his car.    Time  6    Period  Weeks    Status  New            Plan - 12/30/19 1149    Clinical Impression Statement  Patient has not had any episodes of pain since last treatment. He is working on his exercises at home. Tolerated DN and additional exercises well. Reports some increased in stiffness or tightness in the LB after treatment. He has poor segmental mobility with limited lumbar extension. Noted some improvement with exercise but did have increased  soreness.    Rehab Potential  Excellent    PT Frequency  2x / week    PT Duration  6 weeks    PT Treatment/Interventions  ADLs/Self Care Home Management;Cryotherapy;Electrical Stimulation;Moist Heat;Traction;Therapeutic exercise;Therapeutic activities;Neuromuscular re-education;Patient/family education;Dry needling;Taping;Spinal Manipulations    PT Next Visit Plan  review HEP; assess response to DN/STW to paraspinals/multifidi; progress with core stabilization    PT Home Exercise Plan  [id number]; VHN    Consulted and Agree with Plan of Care  Patient       Patient will benefit from skilled therapeutic intervention in order to improve the following deficits and impairments:     Visit Diagnosis: Muscle spasm of back  Muscle weakness (generalized)  Abnormal posture  Chronic bilateral low back pain without sciatica     Problem List Patient Active Problem List   Diagnosis Date Noted  . DDD (degenerative disc disease), cervical 12/18/2019  . Lumbar degenerative disc disease 12/18/2019  . Umbilical hernia without obstruction and without gangrene 10/03/2018  . Hypercholesteremia 09/08/2016  . Osteoarthritis of knees, bilateral 09/08/2016  . Blood in semen 04/22/2014  . Physical exam, annual 08/29/2012  . Obstructive sleep apnea 05/16/2008  . ACTINIC KERATOSIS 05/16/2008  . Impaired fasting glucose 05/15/2008    Mariajose Mow Nilda Simmer PT, MPH  12/30/2019, 11:53 AM  [facility] [place] [address] [place], [place], [address] Phone: [phone]   Fax:  [phone]  Name: Daniel Carpenter MRN: [id number] Date of Birth: [birth year]

## 2019-12-30 NOTE — Patient Instructions (Signed)
Trigger Point Dry Needling  . What is Trigger Point Dry Needling (DN)? o DN is a physical therapy technique used to treat muscle pain and dysfunction. Specifically, DN helps deactivate muscle trigger points (muscle knots).  o A thin filiform needle is used to penetrate the skin and stimulate the underlying trigger point. The goal is for a local twitch response (LTR) to occur and for the trigger point to relax. No medication of any kind is injected during the procedure.   . What Does Trigger Point Dry Needling Feel Like?  o The procedure feels different for each individual patient. Some patients report that they do not actually feel the needle enter the skin and overall the process is not painful. Very mild bleeding may occur. However, many patients feel a deep cramping in the muscle in which the needle was inserted. This is the local twitch response.   Marland Kitchen How Will I feel after the treatment? o Soreness is normal, and the onset of soreness may not occur for a few hours. Typically this soreness does not last longer than two days.  o Bruising is uncommon, however; ice can be used to decrease any possible bruising.  o In rare cases feeling tired or nauseous after the treatment is normal. In addition, your symptoms may get worse before they get better, this period will typically not last longer than 24 hours.   . What Can I do After My Treatment? o Increase your hydration by drinking more water for the next 24 hours. o You may place ice or heat on the areas treated that have become sore, however, do not use heat on inflamed or bruised areas. Heat often brings more relief post needling. o You can continue your regular activities, but vigorous activity is not recommended initially after the treatment for 24 hours. o DN is best combined with other physical therapy such as strengthening, stretching, and other therapies.   Spinal Mobility (Cat / Camel): Flexion / Extension      1.Cat: Buttocks up, arch  spine segmentally, bottom to top 2.Camel: Reverse movement. lower head, tuck chin, compress chest and abdomen, round back. Hold _10-20__ seconds. Repeat _5-10__ times. Do _2__ sessions per day.  Trunk: Prone Extension (Press-Ups)    Lie on stomach on firm, flat surface. Relax bottom and legs. Raise chest in air with elbows straight. Keep hips flat on surface, sag stomach. Hold __2-3__ seconds. Repeat __5-10__ times. Do _1-2___ sessions per day. CAUTION: Movement should be gentle and slow.  Copyright  VHI. All rights reserved.    Plank    Support body on hands and feet. Keep hips in line with torso and arms straight under chest. Avoid locking elbows. Hold for __30 sec

## 2020-01-02 ENCOUNTER — Other Ambulatory Visit: Payer: Self-pay

## 2020-01-02 ENCOUNTER — Ambulatory Visit (INDEPENDENT_AMBULATORY_CARE_PROVIDER_SITE_OTHER): Payer: Managed Care, Other (non HMO) | Admitting: Rehabilitative and Restorative Service Providers"

## 2020-01-02 DIAGNOSIS — G8929 Other chronic pain: Secondary | ICD-10-CM

## 2020-01-02 DIAGNOSIS — M62830 Muscle spasm of back: Secondary | ICD-10-CM

## 2020-01-02 DIAGNOSIS — R293 Abnormal posture: Secondary | ICD-10-CM

## 2020-01-02 DIAGNOSIS — M545 Low back pain: Secondary | ICD-10-CM

## 2020-01-02 DIAGNOSIS — M6281 Muscle weakness (generalized): Secondary | ICD-10-CM | POA: Diagnosis not present

## 2020-01-02 NOTE — Patient Instructions (Signed)
Access Code: [email or web address]  URL: [email or web address]  Date: 01/02/2020  Prepared by: Margretta Ditty   Exercises Child's Pose with Sidebending - 2 reps - 1 sets - 60 sec hold - 2x daily - 7x weekly Seated Quadratus Lumborum Stretch with Arm Overhead - 1 sets                   - 60 sec hold - 2x daily - 7x weekly Cat Cow - 10 reps - 1 sets - 2x daily - 7x weekly Prone Press Up - 10 reps - 1 sets - 2x daily - 7x weekly Supine Piriformis Stretch - 10 reps - 1 sets - 2x daily - 7x weekly Supine ITB Stretch - 2 reps - 1 sets - 20 seconds hold - 2x daily - 7x weekly Seated Table Hamstring Stretch - 2 reps - 1 sets - 20 seconds hold - 2x daily - 7x weekly

## 2020-01-02 NOTE — Therapy (Signed)
[place], [place], [address] Phone: [phone]   Fax:  [phone]  Physical Therapy Treatment  Patient Details  Name: Daniel Carpenter MRN: [id number] Date of Birth: December 01, 1955 Referring Provider (PT): Dr. Dianah Field   Encounter Date: 01/02/2020  PT End of Session - 01/02/20 2148    Visit Number  3    Number of Visits  12    Date for PT Re-Evaluation  02/06/20    Authorization Type  [employer]    PT Start Time  1616    PT Stop Time  2119    PT Time Calculation (min)  59 min    Activity Tolerance  Patient tolerated treatment well       Past Medical History:  Diagnosis Date  . Actinic keratosis   . Diabetes mellitus    boderline  . Hyperlipidemia   . OSA (obstructive sleep apnea)     Past Surgical History:  Procedure Laterality Date  . NASAL SEPTUM SURGERY  1998  . ROTATOR CUFF REPAIR  2010   right  . VASECTOMY      There were no vitals filed for this visit.  Subjective Assessment - 01/02/20 1616    Subjective  The patient reports he was sore after dry needling but felt fine the next day.  He is wondering if he is doing current HEP right (the ones from last visit).  He is most unsure of the cat/cow because he gets tender where he has experienced shooting pain before.    Pertinent History  arthritis, cervical and lumbar DDD, right knee surgery, h/o L knee pain, RCR    Diagnostic tests  MRI    Patient Stated Goals  To stop back from spasming    Currently in Pain?  No/denies   has knee pain                      [facility] Adult PT Treatment/Exercise - 01/02/20 1625      Ambulation/Gait   Ambulation/Gait  Yes    Gait Pattern  Antalgic   L knee antalgic   Gait Comments  Patient has mild trunk flexion with guarded ambulation at end of therapy.  Attempting to move into neutral spine in standing/during gait leads to "tightness" in low back.      Self-Care   Self-Care  Other  Self-Care Comments    Other Self-Care Comments   PT able to spend time answering questions related to stretches and return to yoga.  He inquired about deep stretch yoga class that holds positions x 1-2 minutes.  We discussed return to class holding for shorter periods, avoiding end range rotation and listening to his body for cues when positions are uncomfortable.  We also discussed modifying activities as needed.      Exercises   Exercises  Lumbar      Lumbar Exercises: Stretches   Active Hamstring Stretch  Right;1 rep;2 reps;30 seconds    Passive Hamstring Stretch  Right;Left;1 rep;30 seconds    Double Knee to Chest Stretch  1 rep;30 seconds    Lower Trunk Rotation  3 reps;20 seconds    Hip Flexor Stretch  Right;Left;30 seconds;2 reps    Hip Flexor Stretch Limitations  in prone *L knee unable to flex due to swelling and pain today, perfomred within tolerance    Prone on Elbows Stretch  1 rep;60 seconds    Press Ups  5 reps   3 second  holds   Quadruped Mid Back Stretch  2 reps    Quadruped Mid Back Stretch Limitations  child's pose    ITB Stretch  Right;Left;2 reps    ITB Stretch Limitations  added to HEP    Piriformis Stretch  Right;Left;2 reps    Figure 4 Stretch  1 rep    Figure 4 Stretch Limitations  tightness greater on the right LE    Other Lumbar Stretch Exercise  seated side bend *per HEP/ recommending changing LEs to switch leg in front for equal stretch      Lumbar Exercises: Supine   Dead Bug  5 reps;5 seconds    Dead Bug Limitations  supine hips flexed to 90 and knees to 90 performing isometric press into thighs for core engagement    Bridge  10 reps;3 seconds    Bridge with March  10 reps      Lumbar Exercises: Sidelying   Hip Abduction  Right;Left;10 reps      Lumbar Exercises: Prone   Straight Leg Raise  10 reps    Straight Leg Raises Limitations  with cues to maintain core engagement    Other Prone Lumbar Exercises  8 point plank engaging transverse abdominus       Lumbar Exercises: Quadruped   Madcat/Old Horse  10 reps    Madcat/Old Horse Limitations  Patient initially c/o soreness when moving into posterior pelvic tilt for cat position.  This reduced after performing other LE and lumbar stretches.  We reviewed this position at end of session and the patient c/o pain during old horse portion.  PT recommended he perform within comfortable ROM to work on improving mobility lumbar spine.    Other Quadruped Lumbar Exercises  eight point plank position working on lifting hips and stomach from mat             PT Education - 01/02/20 1709    Education Details  HEP progression    Person(s) Educated  Patient    Methods  Explanation;Demonstration;Handout    Comprehension  Verbalized understanding;Returned demonstration          PT Long Term Goals - 12/26/19 1218      PT LONG TERM GOAL #1   Title  Ind with HEP for strength and flexibilty to prevent further injury    Time  6    Period  Weeks    Status  New    Target Date  02/06/20      PT LONG TERM GOAL #2   Title  Patient to report decreased stiffness upon standing by 75% or more.    Time  6    Period  Weeks    Status  New      PT LONG TERM GOAL #3   Title  Patient to demonstrate improved core stability with seated MMT and with therex to prevent further spasms.    Time  6    Period  Weeks    Status  New      PT LONG TERM GOAL #4   Title  Patient able to verbalize and demonstrate correct body mechanics to protect low back when working on his car.    Time  6    Period  Weeks    Status  New            Plan - 01/02/20 2202    Clinical Impression Statement  The patient notes soreness with ROM activities for the lumbar spine and PT reviewed all exercises  and added further stretches.  In addition to reduced ROM lumbar spine, he also has tightness in R hip greater than L hip and L knee pain that limited some activities today (did not warm up and walking was antalgic).  PT focused on  stretching and lumbar stabilization activities today.  We spent time reviewing technqiue on exercises.  Patient has increased soreness at end of session and continues with significantly limited spinal mobility into extension.  Plan to continue working to LTGs.    Rehab Potential  Excellent    PT Frequency  2x / week    PT Duration  6 weeks    PT Treatment/Interventions  ADLs/Self Care Home Management;Cryotherapy;Electrical Stimulation;Moist Heat;Traction;Therapeutic exercise;Therapeutic activities;Neuromuscular re-education;Patient/family education;Dry needling;Taping;Spinal Manipulations    PT Next Visit Plan  review HEP; assess response to DN/STW to paraspinals/multifidi; progress with core stabilization    PT Home Exercise Plan  [id number]; [facility]    Consulted and Agree with Plan of Care  Patient       Patient will benefit from skilled therapeutic intervention in order to improve the following deficits and impairments:  Decreased range of motion, Pain, Impaired flexibility, Decreased strength, Postural dysfunction, Increased muscle spasms  Visit Diagnosis: Muscle spasm of back  Muscle weakness (generalized)  Abnormal posture  Chronic bilateral low back pain without sciatica     Problem List Patient Active Problem List   Diagnosis Date Noted  . DDD (degenerative disc disease), cervical 12/18/2019  . Lumbar degenerative disc disease 12/18/2019  . Umbilical hernia without obstruction and without gangrene 10/03/2018  . Hypercholesteremia 09/08/2016  . Osteoarthritis of knees, bilateral 09/08/2016  . Blood in semen 04/22/2014  . Physical exam, annual 08/29/2012  . Obstructive sleep apnea 05/16/2008  . ACTINIC KERATOSIS 05/16/2008  . Impaired fasting glucose 05/15/2008    Lewis Grivas, PT 01/02/2020, 10:06 PM  [facility] [address] [place], [place], [address] Phone: [phone]   Fax:  [phone]  Name: Daniel Carpenter MRN: [id number] Date of Birth: 06-May-1956

## 2020-01-06 ENCOUNTER — Ambulatory Visit (INDEPENDENT_AMBULATORY_CARE_PROVIDER_SITE_OTHER): Payer: Managed Care, Other (non HMO) | Admitting: Rehabilitative and Restorative Service Providers"

## 2020-01-06 ENCOUNTER — Other Ambulatory Visit: Payer: Self-pay

## 2020-01-06 ENCOUNTER — Encounter: Payer: Self-pay | Admitting: Rehabilitative and Restorative Service Providers"

## 2020-01-06 DIAGNOSIS — M62830 Muscle spasm of back: Secondary | ICD-10-CM

## 2020-01-06 DIAGNOSIS — R293 Abnormal posture: Secondary | ICD-10-CM

## 2020-01-06 DIAGNOSIS — M545 Low back pain: Secondary | ICD-10-CM

## 2020-01-06 DIAGNOSIS — M6281 Muscle weakness (generalized): Secondary | ICD-10-CM | POA: Diagnosis not present

## 2020-01-06 DIAGNOSIS — G8929 Other chronic pain: Secondary | ICD-10-CM

## 2020-01-06 NOTE — Therapy (Signed)
[place], [place], [address] Phone: [phone]   Fax:  [phone]  Physical Therapy Treatment  Patient Details  Name: Daniel Carpenter MRN: [id number] Date of Birth: November 26, 1955 Referring Provider (PT): Dr. Dianah Field   Encounter Date: 01/06/2020  PT End of Session - 01/06/20 0804    Visit Number  4    Number of Visits  12    Date for PT Re-Evaluation  02/06/20    Authorization Type  [employer]    PT Start Time  0802    PT Stop Time  0845    PT Time Calculation (min)  43 min    Activity Tolerance  Patient tolerated treatment well       Past Medical History:  Diagnosis Date  . Actinic keratosis   . Diabetes mellitus    boderline  . Hyperlipidemia   . OSA (obstructive sleep apnea)     Past Surgical History:  Procedure Laterality Date  . NASAL SEPTUM SURGERY  1998  . ROTATOR CUFF REPAIR  2010   right  . VASECTOMY      There were no vitals filed for this visit.  Subjective Assessment - 01/06/20 0806    Subjective  Back is about the same - worked in the yard this weekend and has soreness from that. Feels that he is gaining more mobility in the spine. Doing his exercises at home.    Currently in Pain?  No/denies         [facility] PT Assessment - 01/06/20 0001      Assessment   Medical Diagnosis  lumbar spondylosis    Referring Provider (PT)  Dr. Dianah Field    Onset Date/Surgical Date  12/16/19    Hand Dominance  Right    Next MD Visit  6 weeks    Prior Therapy  no      AROM   Overall AROM   --   improving segmental mobility through the lumbar spine      Palpation   Palpation comment  tightness through Lt > Rt QL and bilat lumbar paraspinasl                    [facility] Adult PT Treatment/Exercise - 01/06/20 0001      Lumbar Exercises: Quadruped   Madcat/Old Horse  10 reps   repeated after lumbar mobs and DN   Madcat/Old Horse Limitations  working on sag to increase  segmental mobility       Moist Heat Therapy   Number Minutes Moist Heat  12 Minutes    Moist Heat Location  Lumbar Spine      Manual Therapy   Manual Therapy  Soft tissue mobilization    Manual therapy comments  skilled palpation of tissue for DN     Joint Mobilization  PA mobs and lateral mobs sacrum and lumbar spine Grade III/IV     Soft tissue mobilization  deep tissue work through bilat lumbar musculature        Trigger Point Dry Needling - 01/06/20 0001    Consent Given?  Yes    Education Handout Provided  Previously provided    Other Dry Needling  Lt    Quadratus Lumborum Response  Palpable increased muscle length;Twitch response elicited                PT Long Term Goals - 12/26/19 1218      PT LONG TERM GOAL #1  Title  Ind with HEP for strength and flexibilty to prevent further injury    Time  6    Period  Weeks    Status  New    Target Date  02/06/20      PT LONG TERM GOAL #2   Title  Patient to report decreased stiffness upon standing by 75% or more.    Time  6    Period  Weeks    Status  New      PT LONG TERM GOAL #3   Title  Patient to demonstrate improved core stability with seated MMT and with therex to prevent further spasms.    Time  6    Period  Weeks    Status  New      PT LONG TERM GOAL #4   Title  Patient able to verbalize and demonstrate correct body mechanics to protect low back when working on his car.    Time  6    Period  Weeks    Status  New            Plan - 01/06/20 7829    Clinical Impression Statement  Less stiffness and discomfort in the morning when he awakens. Can tell his mobility is increasing. Good response to lumbar mobilization and DN of Lt QL today with notable improvement in the segmental mobility through the lumbar spine. Progressing gradually toward stated goals of therapy.    Rehab Potential  Excellent    PT Frequency  2x / week    PT Duration  6 weeks    PT Treatment/Interventions  ADLs/Self Care Home  Management;Cryotherapy;Electrical Stimulation;Moist Heat;Traction;Therapeutic exercise;Therapeutic activities;Neuromuscular re-education;Patient/family education;Dry needling;Taping;Spinal Manipulations    PT Next Visit Plan  review HEP; assess response to DN/STW toLt QL; add ant/post pelvic tilt on dyndisc focus on segmental extension(anterior tilt) in sitting;  progress with core stabilization - pt will use TENs unit and moist heat for soreness    PT Home Exercise Plan  [id number]; [facility]    Consulted and Agree with Plan of Care  Patient       Patient will benefit from skilled therapeutic intervention in order to improve the following deficits and impairments:     Visit Diagnosis: Muscle spasm of back  Muscle weakness (generalized)  Abnormal posture  Chronic bilateral low back pain without sciatica     Problem List Patient Active Problem List   Diagnosis Date Noted  . DDD (degenerative disc disease), cervical 12/18/2019  . Lumbar degenerative disc disease 12/18/2019  . Umbilical hernia without obstruction and without gangrene 10/03/2018  . Hypercholesteremia 09/08/2016  . Osteoarthritis of knees, bilateral 09/08/2016  . Blood in semen 04/22/2014  . Physical exam, annual 08/29/2012  . Obstructive sleep apnea 05/16/2008  . ACTINIC KERATOSIS 05/16/2008  . Impaired fasting glucose 05/15/2008    Saliou Barnier Rober Minion PT, MPH  01/06/2020, 8:46 AM  [facility] [address] [place], [place], [address] Phone: [phone]   Fax:  [phone]  Name: Daniel Carpenter MRN: [id number] Date of Birth: 09-14-1956

## 2020-01-09 ENCOUNTER — Other Ambulatory Visit: Payer: Self-pay

## 2020-01-09 ENCOUNTER — Ambulatory Visit (INDEPENDENT_AMBULATORY_CARE_PROVIDER_SITE_OTHER): Payer: Managed Care, Other (non HMO) | Admitting: Rehabilitative and Restorative Service Providers"

## 2020-01-09 ENCOUNTER — Encounter: Payer: Self-pay | Admitting: Rehabilitative and Restorative Service Providers"

## 2020-01-09 DIAGNOSIS — G8929 Other chronic pain: Secondary | ICD-10-CM

## 2020-01-09 DIAGNOSIS — M6281 Muscle weakness (generalized): Secondary | ICD-10-CM | POA: Diagnosis not present

## 2020-01-09 DIAGNOSIS — M545 Low back pain: Secondary | ICD-10-CM

## 2020-01-09 DIAGNOSIS — R293 Abnormal posture: Secondary | ICD-10-CM

## 2020-01-09 DIAGNOSIS — M62830 Muscle spasm of back: Secondary | ICD-10-CM

## 2020-01-09 NOTE — Therapy (Signed)
[place], [place], [address] Phone: [phone]   Fax:  [phone]  Physical Therapy Treatment  Patient Details  Name: Daniel Carpenter MRN: [id number] Date of Birth: Sep 28, 1956 Referring Provider (PT): Dr. Dianah Field   Encounter Date: 01/09/2020  PT End of Session - 01/09/20 1618    Visit Number  5    Number of Visits  12    Date for PT Re-Evaluation  02/06/20    Authorization Type  [employer]    PT Start Time  1616    PT Stop Time  1700   moist heat end of treatment   PT Time Calculation (min)  44 min       Past Medical History:  Diagnosis Date  . Actinic keratosis   . Diabetes mellitus    boderline  . Hyperlipidemia   . OSA (obstructive sleep apnea)     Past Surgical History:  Procedure Laterality Date  . NASAL SEPTUM SURGERY  1998  . ROTATOR CUFF REPAIR  2010   right  . VASECTOMY      There were no vitals filed for this visit.  Subjective Assessment - 01/09/20 1619    Subjective  Hurting when he left therapy last visit but by lunch time he was feeling great and has continued to feel good. Working on the exercises at home    Currently in Pain?  No/denies                       [facility] Adult PT Treatment/Exercise - 01/09/20 0001      Lumbar Exercises: Standing   Other Standing Lumbar Exercises  planks at wall forward ~ 30 sec x 2; side plank at wall ~20 sec x 1       Lumbar Exercises: Seated   Sit to Stand  10 reps    Sit to Stand Limitations  working on hinge from hips     Other Seated Lumbar Exercises  anterior/posterior pelvic tilt - without and with dyna disc x 6-8 each     Other Seated Lumbar Exercises  lateral trunk flexion stretch over pillow 30 sec x 3 to Rt; 2 to Lt       Lumbar Exercises: Quadruped   Madcat/Old Horse  10 reps   repeated after lumbar mobs and DN   Madcat/Old Horse Limitations  working on sag to increase segmental mobility        Moist Heat Therapy   Number Minutes Moist Heat  12 Minutes    Moist Heat Location  Lumbar Spine      Manual Therapy   Manual Therapy  Soft tissue mobilization    Manual therapy comments  skilled palpation of tissue for DN     Joint Mobilization  PA mobs and lateral mobs sacrum and lumbar spine Grade III/IV     Soft tissue mobilization  deep tissue work through bilat lumbar musculature        Trigger Point Dry Needling - 01/09/20 0001    Consent Given?  Yes    Education Handout Provided  Previously provided    Other Dry Needling  Lt pt Rt sidelying     Quadratus Lumborum Response  Palpable increased muscle length;Twitch response elicited           PT Education - 01/09/20 1638    Education Details  HEP    Person(s) Educated  Patient    Methods  Explanation;Demonstration;Tactile cues;Verbal cues;Handout  Comprehension  Verbalized understanding;Returned demonstration;Verbal cues required;Tactile cues required          PT Long Term Goals - 12/26/19 1218      PT LONG TERM GOAL #1   Title  Ind with HEP for strength and flexibilty to prevent further injury    Time  6    Period  Weeks    Status  New    Target Date  02/06/20      PT LONG TERM GOAL #2   Title  Patient to report decreased stiffness upon standing by 75% or more.    Time  6    Period  Weeks    Status  New      PT LONG TERM GOAL #3   Title  Patient to demonstrate improved core stability with seated MMT and with therex to prevent further spasms.    Time  6    Period  Weeks    Status  New      PT LONG TERM GOAL #4   Title  Patient able to verbalize and demonstrate correct body mechanics to protect low back when working on his car.    Time  6    Period  Weeks    Status  New            Plan - 01/09/20 1654    Clinical Impression Statement  Improved segmental mobility through lumbar spine; increased mobilty with PA mobs and with exercise. Added seated anterior/posterior tilt on dynadisc and core  work. Worked on hinged sit to stand with core engaged. Progressing well toward stated goals of therapy.    Rehab Potential  Excellent    PT Frequency  2x / week    PT Duration  6 weeks    PT Treatment/Interventions  ADLs/Self Care Home Management;Cryotherapy;Electrical Stimulation;Moist Heat;Traction;Therapeutic exercise;Therapeutic activities;Neuromuscular re-education;Patient/family education;Dry needling;Taping;Spinal Manipulations    PT Next Visit Plan  continue DN/STW toLt QL as indicated; check ant/post pelvic tilt on dyndisc focus on segmental extension(anterior tilt) in sitting;  progress with core stabilization - pt will use TENs unit and moist heat for soreness    PT Home Exercise Plan  [id number]; [facility]       Patient will benefit from skilled therapeutic intervention in order to improve the following deficits and impairments:     Visit Diagnosis: Muscle spasm of back  Muscle weakness (generalized)  Abnormal posture  Chronic bilateral low back pain without sciatica     Problem List Patient Active Problem List   Diagnosis Date Noted  . DDD (degenerative disc disease), cervical 12/18/2019  . Lumbar degenerative disc disease 12/18/2019  . Umbilical hernia without obstruction and without gangrene 10/03/2018  . Hypercholesteremia 09/08/2016  . Osteoarthritis of knees, bilateral 09/08/2016  . Blood in semen 04/22/2014  . Physical exam, annual 08/29/2012  . Obstructive sleep apnea 05/16/2008  . ACTINIC KERATOSIS 05/16/2008  . Impaired fasting glucose 05/15/2008    [place] Madding Rober Minion PT, MPH  01/09/2020, 4:59 PM  [facility] [address] [place], [place], [address] Phone: [phone]   Fax:  [phone]  Name: Daniel Carpenter MRN: [id number] Date of Birth: Apr 04, 1956

## 2020-01-09 NOTE — Patient Instructions (Signed)
dyna disc - cat cow in sitting   Plank forward and side at wall 30-60 seconds 3-5 reps  CORE TIGHT   Sit to stand  Hinging from hips - with yard stick

## 2020-01-10 MED ORDER — MELOXICAM 15 MG PO TABS: 15.0000 mg | ORAL_TABLET | Freq: Every day | ORAL | 3 refills | Status: DC | PRN

## 2020-01-13 ENCOUNTER — Ambulatory Visit (INDEPENDENT_AMBULATORY_CARE_PROVIDER_SITE_OTHER): Payer: Managed Care, Other (non HMO) | Admitting: Rehabilitative and Restorative Service Providers"

## 2020-01-13 ENCOUNTER — Other Ambulatory Visit: Payer: Self-pay

## 2020-01-13 DIAGNOSIS — M62830 Muscle spasm of back: Secondary | ICD-10-CM | POA: Diagnosis not present

## 2020-01-13 DIAGNOSIS — G8929 Other chronic pain: Secondary | ICD-10-CM

## 2020-01-13 DIAGNOSIS — M545 Low back pain: Secondary | ICD-10-CM | POA: Diagnosis not present

## 2020-01-13 DIAGNOSIS — M6281 Muscle weakness (generalized): Secondary | ICD-10-CM

## 2020-01-13 DIAGNOSIS — R293 Abnormal posture: Secondary | ICD-10-CM | POA: Diagnosis not present

## 2020-01-13 NOTE — Therapy (Signed)
[place], [place], [address] Phone: [phone]   Fax:  [phone]  Physical Therapy Treatment  Patient Details  Name: Daniel Carpenter MRN: [id number] Date of Birth: 12/26/1955 Referring Provider (PT): Dr. Dianah Field   Encounter Date: 01/13/2020  PT End of Session - 01/13/20 0724    Visit Number  6    Number of Visits  12    Date for PT Re-Evaluation  02/06/20    Authorization Type  [employer]    PT Start Time  0715    PT Stop Time  0805    PT Time Calculation (min)  50 min       Past Medical History:  Diagnosis Date  . Actinic keratosis   . Diabetes mellitus    boderline  . Hyperlipidemia   . OSA (obstructive sleep apnea)     Past Surgical History:  Procedure Laterality Date  . NASAL SEPTUM SURGERY  1998  . ROTATOR CUFF REPAIR  2010   right  . VASECTOMY      There were no vitals filed for this visit.  Subjective Assessment - 01/13/20 0717    Subjective  The patient reports no pain in his back.  He has discomfort in the morning upon waking and once he moves around in the morning it gets loosened up.    Pertinent History  arthritis, cervical and lumbar DDD, right knee surgery, h/o L knee pain, RCR    Patient Stated Goals  To stop back from spasming    Currently in Pain?  No/denies                       [facility] Adult PT Treatment/Exercise - 01/13/20 0724      Ambulation/Gait   Ambulation/Gait  Yes    Gait Comments  gait in between ther ex to work on L heel strike, equal weight shift, and L knee extension mid stance phase      Exercises   Exercises  Lumbar      Lumbar Exercises: Stretches   Active Hamstring Stretch  Right;Left;2 reps;30 seconds    Hip Flexor Stretch  2 reps;Right;Left;30 seconds    Quad Stretch  2 reps;Right;Left;30 seconds    Quad Stretch Limitations  prone, modified L leg due to knee tightness and used pillow under distal femur    Other Lumbar  Stretch Exercise  standing forward fold x 30 second holds    Other Lumbar Stretch Exercise  standing hip adductor stretch R and L; standing trunk rotation x 5 reps to each side      Lumbar Exercises: Standing   Other Standing Lumbar Exercises  single limb stance L LE x 10 second holds, added R hip ER (led to L knee pain so modified), then added single leg to initiate forward fold (initiation of warrior 3) on L LE for balance/stance control    Other Standing Lumbar Exercises  standing knee extension  for terminal knee ex L LE x 12 reps into ball against wall      Lumbar Exercises: Seated   Other Seated Lumbar Exercises  physioball with anterior/posterior pelvic tilts *patient uses low thoracic spine for majority of mobility    Other Seated Lumbar Exercises  Seated marching x 4 reps R and L alternating with cues for core engagement      Lumbar Exercises: Supine   Bridge  10 reps    Bridge Limitations  focused on segmental bridges  beginning with small lift and increasing as able to perform correctly    Straight Leg Raise  10 reps    Straight Leg Raises Limitations  L with external rotation for VMO      Lumbar Exercises: Quadruped   Madcat/Old Horse  10 reps    Straight Leg Raise  5 reps    Opposite Arm/Leg Raise  5 reps    Other Quadruped Lumbar Exercises  primal push up x 5 reps x 5 second holds      Manual Therapy   Manual Therapy  Joint mobilization    Joint Mobilization  sidelying lumbar L3-L4 and L4-L5 grade II-III rotation in sidelying with superior segment blocked and inferior segment mobilized (used knee flexion to chest for positioning)                  PT Long Term Goals - 12/26/19 1218      PT LONG TERM GOAL #1   Title  Ind with HEP for strength and flexibilty to prevent further injury    Time  6    Period  Weeks    Status  New    Target Date  02/06/20      PT LONG TERM GOAL #2   Title  Patient to report decreased stiffness upon standing by 75% or more.     Time  6    Period  Weeks    Status  New      PT LONG TERM GOAL #3   Title  Patient to demonstrate improved core stability with seated MMT and with therex to prevent further spasms.    Time  6    Period  Weeks    Status  New      PT LONG TERM GOAL #4   Title  Patient able to verbalize and demonstrate correct body mechanics to protect low back when working on his car.    Time  6    Period  Weeks    Status  New            Plan - 01/13/20 2585    Clinical Impression Statement  The patient notes continued improvement with therapy.  He has some achiness in L4-L5 region at end of therapy, reporting this is typical. PT encouraging equal weight shift in standing (versus R loading) and also worked on heel strike with gait to increase L knee extension.    Rehab Potential  Excellent    PT Frequency  2x / week    PT Duration  6 weeks    PT Treatment/Interventions  ADLs/Self Care Home Management;Cryotherapy;Electrical Stimulation;Moist Heat;Traction;Therapeutic exercise;Therapeutic activities;Neuromuscular re-education;Patient/family education;Dry needling;Taping;Spinal Manipulations    PT Next Visit Plan  continue DN/STW toLt QL as indicated; check ant/post pelvic tilt on dyndisc focus on segmental extension(anterior tilt) in sitting;  progress with core stabilization - pt will use TENs unit and moist heat for soreness    PT Home Exercise Plan  [id number]; [facility]    Consulted and Agree with Plan of Care  Patient       Patient will benefit from skilled therapeutic intervention in order to improve the following deficits and impairments:  Decreased range of motion, Pain, Impaired flexibility, Decreased strength, Postural dysfunction, Increased muscle spasms  Visit Diagnosis: Muscle spasm of back  Muscle weakness (generalized)  Abnormal posture  Chronic bilateral low back pain without sciatica     Problem List Patient Active Problem List   Diagnosis Date Noted  . DDD (degenerative disc  disease), cervical 12/18/2019  . Lumbar degenerative disc disease 12/18/2019  . Umbilical hernia without obstruction and without gangrene 10/03/2018  . Hypercholesteremia 09/08/2016  . Osteoarthritis of knees, bilateral 09/08/2016  . Blood in semen 04/22/2014  . Physical exam, annual 08/29/2012  . Obstructive sleep apnea 05/16/2008  . ACTINIC KERATOSIS 05/16/2008  . Impaired fasting glucose 05/15/2008    Nakoa Ganus, PT 01/13/2020, 8:30 AM  [facility] [address] [place], [place], [address] Phone: [phone]   Fax:  [phone]  Name: DIRK VANAMAN MRN: [id number] Date of Birth: 11-22-1955

## 2020-01-15 ENCOUNTER — Encounter: Payer: Self-pay | Admitting: Rehabilitative and Restorative Service Providers"

## 2020-01-15 ENCOUNTER — Ambulatory Visit (INDEPENDENT_AMBULATORY_CARE_PROVIDER_SITE_OTHER): Payer: Managed Care, Other (non HMO) | Admitting: Rehabilitative and Restorative Service Providers"

## 2020-01-15 ENCOUNTER — Other Ambulatory Visit: Payer: Self-pay

## 2020-01-15 DIAGNOSIS — M545 Low back pain: Secondary | ICD-10-CM | POA: Diagnosis not present

## 2020-01-15 DIAGNOSIS — G8929 Other chronic pain: Secondary | ICD-10-CM

## 2020-01-15 DIAGNOSIS — M62830 Muscle spasm of back: Secondary | ICD-10-CM

## 2020-01-15 DIAGNOSIS — R293 Abnormal posture: Secondary | ICD-10-CM

## 2020-01-15 DIAGNOSIS — M6281 Muscle weakness (generalized): Secondary | ICD-10-CM | POA: Diagnosis not present

## 2020-01-15 NOTE — Therapy (Signed)
[place], [place], [address] Phone: [phone]   Fax:  [phone]  Physical Therapy Treatment  Patient Details  Name: Daniel Carpenter MRN: [id number] Date of Birth: 12-31-1955 Referring Provider (PT): Dr. Dianah Field   Encounter Date: 01/15/2020  PT End of Session - 01/15/20 0719    Visit Number  7    Number of Visits  12    Date for PT Re-Evaluation  02/06/20    Authorization Type  [employer]    PT Start Time  0715    PT Stop Time  0800    PT Time Calculation (min)  45 min    Activity Tolerance  Patient tolerated treatment well    Behavior During Therapy  [facility] for tasks assessed/performed       Past Medical History:  Diagnosis Date  . Actinic keratosis   . Diabetes mellitus    boderline  . Hyperlipidemia   . OSA (obstructive sleep apnea)     Past Surgical History:  Procedure Laterality Date  . NASAL SEPTUM SURGERY  1998  . ROTATOR CUFF REPAIR  2010   right  . VASECTOMY      There were no vitals filed for this visit.  Subjective Assessment - 01/15/20 0717    Subjective  The patient has some mild fluid on the L knee.  He awoke with L low back discomfort (stiffness) that resolves with stretching.    Pertinent History  arthritis, cervical and lumbar DDD, right knee surgery, h/o L knee pain, RCR    Patient Stated Goals  To stop back from spasming    Currently in Pain?  No/denies                       [facility] Adult PT Treatment/Exercise - 01/15/20 0724      Ambulation/Gait   Ambulation/Gait  Yes    Gait Comments  Gait working on hip initiation in the transverse plane with tactile resistance.      Exercises   Exercises  Lumbar      Lumbar Exercises: Stretches   Hip Flexor Stretch  Right;Left;1 rep;30 seconds    Other Lumbar Stretch Exercise  sidelying QL stretch L side    Other Lumbar Stretch Exercise  Standing lateral glides near wall for self mobilization.  Foam  roller in long sitting for gluteals and low back musculature rolling.      Lumbar Exercises: Aerobic   Tread Mill  5 minutes with UE support up to 3.0 mph and 3% incline      Lumbar Exercises: Standing   Other Standing Lumbar Exercises  single limb stance L LE working on quad engagement x 3 reps, BOSU standing for core stability working on mini squats with cues for hip hinging and straight spinal positioning; core stability on BOSU with alternating UE reaches.       Lumbar Exercises: Seated   Other Seated Lumbar Exercises  Physiodisc sitting with segmental anterior/posterior pelvic tilt with tactile cues for end range motion      Lumbar Exercises: Supine   Bridge  10 reps    Other Supine Lumbar Exercises  bridge on physioball x 10 reps and then bridges with alternating LE raises from ball x 10 reps    Other Supine Lumbar Exercises  bridges with physioball knee flexion/extension/curls x 5 reps                  PT Long Term  Goals - 12/26/19 1218      PT LONG TERM GOAL #1   Title  Ind with HEP for strength and flexibilty to prevent further injury    Time  6    Period  Weeks    Status  New    Target Date  02/06/20      PT LONG TERM GOAL #2   Title  Patient to report decreased stiffness upon standing by 75% or more.    Time  6    Period  Weeks    Status  New      PT LONG TERM GOAL #3   Title  Patient to demonstrate improved core stability with seated MMT and with therex to prevent further spasms.    Time  6    Period  Weeks    Status  New      PT LONG TERM GOAL #4   Title  Patient able to verbalize and demonstrate correct body mechanics to protect low back when working on his car.    Time  6    Period  Weeks    Status  New            Plan - 01/15/20 1504    Clinical Impression Statement  The patient is improving upright posture and decreasing pain.  PT is continuing to increase strengthening and postural stabilization.  The patient is also working on  awareness of offloading the L side (from prior knee injury) when standing.  PT to continue progressing strengthening and work on stretching L paraspinal musculature (in low t-spine).    Rehab Potential  Excellent    PT Frequency  2x / week    PT Duration  6 weeks    PT Treatment/Interventions  ADLs/Self Care Home Management;Cryotherapy;Electrical Stimulation;Moist Heat;Traction;Therapeutic exercise;Therapeutic activities;Neuromuscular re-education;Patient/family education;Dry needling;Taping;Spinal Manipulations    PT Next Visit Plan  continue DN/STW toLt QL as indicated; check ant/post pelvic tilt on dyndisc focus on segmental extension(anterior tilt) in sitting;  progress with core stabilization - pt will use TENs unit and moist heat for soreness    PT Home Exercise Plan  [id number]; [facility]    Consulted and Agree with Plan of Care  Patient       Patient will benefit from skilled therapeutic intervention in order to improve the following deficits and impairments:  Decreased range of motion, Pain, Impaired flexibility, Decreased strength, Postural dysfunction, Increased muscle spasms  Visit Diagnosis: Muscle spasm of back  Muscle weakness (generalized)  Abnormal posture  Chronic bilateral low back pain without sciatica     Problem List Patient Active Problem List   Diagnosis Date Noted  . DDD (degenerative disc disease), cervical 12/18/2019  . Lumbar degenerative disc disease 12/18/2019  . Umbilical hernia without obstruction and without gangrene 10/03/2018  . Hypercholesteremia 09/08/2016  . Osteoarthritis of knees, bilateral 09/08/2016  . Blood in semen 04/22/2014  . Physical exam, annual 08/29/2012  . Obstructive sleep apnea 05/16/2008  . ACTINIC KERATOSIS 05/16/2008  . Impaired fasting glucose 05/15/2008    Gasper Hopes, PT 01/15/2020, 3:12 PM  [facility] [address] [place], [place], [address] Phone:  [phone]   Fax:  [phone]  Name: Daniel Carpenter MRN: [id number] Date of Birth: [birth year]

## 2020-01-27 ENCOUNTER — Ambulatory Visit (INDEPENDENT_AMBULATORY_CARE_PROVIDER_SITE_OTHER): Payer: Managed Care, Other (non HMO) | Admitting: Rehabilitative and Restorative Service Providers"

## 2020-01-27 ENCOUNTER — Encounter: Payer: Self-pay | Admitting: Rehabilitative and Restorative Service Providers"

## 2020-01-27 ENCOUNTER — Other Ambulatory Visit: Payer: Self-pay

## 2020-01-27 DIAGNOSIS — M545 Low back pain, unspecified: Secondary | ICD-10-CM

## 2020-01-27 DIAGNOSIS — G8929 Other chronic pain: Secondary | ICD-10-CM

## 2020-01-27 DIAGNOSIS — M62830 Muscle spasm of back: Secondary | ICD-10-CM | POA: Diagnosis not present

## 2020-01-27 DIAGNOSIS — R293 Abnormal posture: Secondary | ICD-10-CM | POA: Diagnosis not present

## 2020-01-27 DIAGNOSIS — M6281 Muscle weakness (generalized): Secondary | ICD-10-CM

## 2020-01-27 NOTE — Therapy (Signed)
[place], [place], [address] Phone: [phone]   Fax:  [phone]  Physical Therapy Treatment  Patient Details  Name: Daniel Carpenter MRN: [id number] Date of Birth: 01/19/1956 Referring Provider (PT): Dr. Dianah Field   Encounter Date: 01/27/2020  PT End of Session - 01/27/20 0845    Visit Number  8    Number of Visits  12    Date for PT Re-Evaluation  02/06/20    Authorization Type  [employer]    PT Start Time  0720    PT Stop Time  0804    PT Time Calculation (min)  44 min    Activity Tolerance  Patient tolerated treatment well    Behavior During Therapy  [facility] for tasks assessed/performed       Past Medical History:  Diagnosis Date  . Actinic keratosis   . Diabetes mellitus    boderline  . Hyperlipidemia   . OSA (obstructive sleep apnea)     Past Surgical History:  Procedure Laterality Date  . NASAL SEPTUM SURGERY  1998  . ROTATOR CUFF REPAIR  2010   right  . VASECTOMY      There were no vitals filed for this visit.  Subjective Assessment - 01/27/20 0724    Subjective  The patient continues with knee swelling.  His back has not been painful, although still has occasional tightness noted.  Tightness resolves with stretching.    Pertinent History  arthritis, cervical and lumbar DDD, right knee surgery, h/o L knee pain, RCR    Patient Stated Goals  To stop back from spasming    Currently in Pain?  No/denies                       [facility] Adult PT Treatment/Exercise - 01/27/20 0731      Ambulation/Gait   Ambulation/Gait  Yes    Gait Comments  gait in between exercises to work on posture and assess how he feels after interventions.      Exercises   Exercises  Lumbar      Lumbar Exercises: Stretches   Hip Flexor Stretch  Right;Left;2 reps;30 seconds    Other Lumbar Stretch Exercise  standing forward flexion with hip adductor stretch; modified standing child's pose  (discussed for yoga due to L knee swelling/pain)      Lumbar Exercises: Aerobic   Tread Mill  4 minutes up to 3.0 mph      Lumbar Exercises: Standing   Other Standing Lumbar Exercises  BOSU with alternating UE reaching, BOSU attempting mini squats however painful to the L knee so stopped.     Other Standing Lumbar Exercises  High lunge isometric hold with 10 rotations for core/trunk       Lumbar Exercises: Seated   Other Seated Lumbar Exercises  Physiodisc sitting with segmental anterior/posterior pelvic tilt with tactile cues for end range motion      Lumbar Exercises: Sidelying   Other Sidelying Lumbar Exercises  side plank extended position.      Lumbar Exercises: Prone   Other Prone Lumbar Exercises  plank position (8 point), extended plank position    Other Prone Lumbar Exercises  prone press ups with mobilization with movement lumbar spine      Manual Therapy   Manual Therapy  Joint mobilization    Joint Mobilization  PA mobs and lateral mobs sacrum and lumbar + low thoracic spine grade II-III  PT Education - 01/27/20 0800    Education Details  HEP    Person(s) Educated  Patient    Methods  Explanation;Demonstration;Handout    Comprehension  Verbalized understanding;Returned demonstration          PT Long Term Goals - 01/27/20 0802      PT LONG TERM GOAL #1   Title  Ind with HEP for strength and flexibilty to prevent further injury    Time  6    Period  Weeks    Status  On-going      PT LONG TERM GOAL #2   Title  Patient to report decreased stiffness upon standing by 75% or more.    Baseline  50% improvement    Time  6    Period  Weeks    Status  Partially Met      PT LONG TERM GOAL #3   Title  Patient to demonstrate improved core stability with seated MMT and with therex to prevent further spasms.    Time  6    Period  Weeks    Status  On-going      PT LONG TERM GOAL #4   Title  Patient able to verbalize and demonstrate correct body  mechanics to protect low back when working on his car.    Time  6    Period  Weeks    Status  On-going            Plan - 01/27/20 2094    Clinical Impression Statement  The patient is progressing with low back stability and reducing pain.  His L knee is limiting his ability to perform some exercise and also he notes offloading L LE due to L knee pain.  PT reviewed some HEP today and plans to d/c plan with HEP.  Patient is going to return to yoga class this week to determine his tolreance.    Rehab Potential  Excellent    PT Frequency  2x / week    PT Duration  6 weeks    PT Treatment/Interventions  ADLs/Self Care Home Management;Cryotherapy;Electrical Stimulation;Moist Heat;Traction;Therapeutic exercise;Therapeutic activities;Neuromuscular re-education;Patient/family education;Dry needling;Taping;Spinal Manipulations    PT Next Visit Plan  check LTGS working towards d/c; continue DN/STW toLt QL as indicated; check ant/post pelvic tilt on dyndisc focus on segmental extension(anterior tilt) in sitting;  progress with core stabilization - pt will use TENs unit and moist heat for soreness    PT [place]; [facility]    Consulted and Agree with Plan of Care  Patient       Patient will benefit from skilled therapeutic intervention in order to improve the following deficits and impairments:  Decreased range of motion, Pain, Impaired flexibility, Decreased strength, Postural dysfunction, Increased muscle spasms  Visit Diagnosis: Muscle spasm of back  Muscle weakness (generalized)  Abnormal posture  Chronic bilateral low back pain without sciatica     Problem List Patient Active Problem List   Diagnosis Date Noted  . DDD (degenerative disc disease), cervical 12/18/2019  . Lumbar degenerative disc disease 12/18/2019  . Umbilical hernia without obstruction and without gangrene 10/03/2018  . Hypercholesteremia 09/08/2016  . Osteoarthritis of knees, bilateral 09/08/2016   . Blood in semen 04/22/2014  . Physical exam, annual 08/29/2012  . Obstructive sleep apnea 05/16/2008  . ACTINIC KERATOSIS 05/16/2008  . Impaired fasting glucose 05/15/2008    Haydin Calandra, PT 01/27/2020, 8:56 AM  [facility] [place],  [place], [address] Phone: [phone]   Fax:  [phone]  Name: Daniel Carpenter MRN: [id number] Date of Birth: [birth year]

## 2020-01-27 NOTE — Patient Instructions (Signed)
Access Code: [email or web address] URL: [email or web address] Date: 01/27/2020 Prepared by: Margretta Ditty  Exercises Seated Quadratus Lumborum Stretch with Arm Overhead - 2 x daily - 7 x weekly - 1 sets - 60 sec hold Cat Cow - 2 x daily - 7 x weekly - 10 reps - 1 sets Prone Press Up - 2 x daily - 7 x weekly - 10 reps - 1 sets Supine Piriformis Stretch - 2 x daily - 7 x weekly - 10 reps - 1 sets Supine ITB Stretch - 2 x daily - 7 x weekly - 2 reps - 1 sets - 20 seconds hold Seated Table Hamstring Stretch - 2 x daily - 7 x weekly - 2 reps - 1 sets - 20 seconds hold Standing Shoulder and Trunk Flexion at Table - 2 x daily - 7 x weekly - 1 sets - 3 reps - 20 seconds hold Single Leg Stance - 1 x daily - 7 x weekly - 3 reps - 10 seconds hold Side Plank on Elbow - 2 x daily - 7 x weekly - 1 sets - 10 reps Plank on Knees - 2 x daily - 7 x weekly - 1 sets - 5 reps - 10 seconds hold

## 2020-01-30 ENCOUNTER — Other Ambulatory Visit: Payer: Self-pay

## 2020-01-30 ENCOUNTER — Encounter: Payer: Self-pay | Admitting: Rehabilitative and Restorative Service Providers"

## 2020-01-30 ENCOUNTER — Ambulatory Visit (INDEPENDENT_AMBULATORY_CARE_PROVIDER_SITE_OTHER): Payer: Managed Care, Other (non HMO) | Admitting: Rehabilitative and Restorative Service Providers"

## 2020-01-30 DIAGNOSIS — M545 Low back pain, unspecified: Secondary | ICD-10-CM

## 2020-01-30 DIAGNOSIS — M62830 Muscle spasm of back: Secondary | ICD-10-CM | POA: Diagnosis not present

## 2020-01-30 DIAGNOSIS — M6281 Muscle weakness (generalized): Secondary | ICD-10-CM

## 2020-01-30 DIAGNOSIS — R293 Abnormal posture: Secondary | ICD-10-CM

## 2020-01-30 DIAGNOSIS — G8929 Other chronic pain: Secondary | ICD-10-CM

## 2020-01-30 NOTE — Therapy (Addendum)
[place], [place], [address] Phone: [phone]   Fax:  [phone]  Physical Therapy Treatment and On Hold Note/ Discharge Summary  Patient Details  Name: Daniel Carpenter MRN: [id number] Date of Birth: [birth year] Referring Provider (PT): Dr. Dianah Field   Encounter Date: 01/30/2020  PT End of Session - 01/30/20 1618    Visit Number  9    Number of Visits  12    Date for PT Re-Evaluation  02/06/20    Authorization Type  [employer]    PT Start Time  1614    PT Stop Time  1655    PT Time Calculation (min)  41 min    Activity Tolerance  Patient tolerated treatment well    Behavior During Therapy  [facility] for tasks assessed/performed       Past Medical History:  Diagnosis Date  . Actinic keratosis   . Diabetes mellitus    boderline  . Hyperlipidemia   . OSA (obstructive sleep apnea)     Past Surgical History:  Procedure Laterality Date  . NASAL SEPTUM SURGERY  1998  . ROTATOR CUFF REPAIR  2010   right  . VASECTOMY      There were no vitals filed for this visit.  Subjective Assessment - 01/30/20 1615    Subjective  The patient went to yoga on Monday to do the stretching class.  He noticed when he sat on the yoga mat he got a burning in his tailbone.  He put a towel under his hips and it relieved the pain.  At end of session, he got some tightness in low back when lying supine.    Pertinent History  arthritis, cervical and lumbar DDD, right knee surgery, h/o L knee pain, RCR    Patient Stated Goals  To stop back from spasming    Currently in Pain?  No/denies         [facility] PT Assessment - 01/30/20 1657      Strength   Overall Strength Comments  5/5 bilat hip flexion, bilat hip abduction, bilat knee flexion.                   [facility] Adult PT Treatment/Exercise - 01/30/20 1654      Self-Care   Self-Care  Other Self-Care Comments    Other Self-Care Comments   patient noted in  intake pain in low back when returning to yoga.  PT reviewed seated pose and offered suggestions to raise hips by using a folded yoga mat or towel or sitting on a yoga block to raise hips 2-3".  When he sits on ground, he has lumbar kyphosis and this strains low back structures.  Reviewed posture in sitting poses for yoga.      Exercises   Exercises  Lumbar      Lumbar Exercises: Standing   Other Standing Lumbar Exercises  forward half fold and rotation with UE support      Lumbar Exercises: Seated   Other Seated Lumbar Exercises  seated trunk rotation *initially performing mobilization with movement using a belt, however patient not getting end range stretch, therefore performed this in supine with lumbar rotation and discussed seated options.      Lumbar Exercises: Supine   Other Supine Lumbar Exercises  lumbar rotation      Lumbar Exercises: Sidelying   Other Sidelying Lumbar Exercises  side plank extended position x 2 reps *initially felt discomfort in low back and  had patient perform cat/cow first to get mobilization prior to exercise      Lumbar Exercises: Prone   Other Prone Lumbar Exercises  plank position with 10 second holds with knees (8 point plank), then standard plank, and extended plank      Lumbar Exercises: Quadruped   Madcat/Old Horse  5 reps    Madcat/Old Horse Limitations  added a red theraband with cat/cow exercise to focus on mobility at thoracolumbar junction    Other Quadruped Lumbar Exercises  Rocking quadriped to heel sitting with resistance of red theraband, however only performed 3 reps due to compensation due to L knee pain             PT Education - 01/30/20 1702    Education Details  updated HEP    Person(s) Educated  Patient    Methods  Explanation;Demonstration;Handout    Comprehension  Verbalized understanding;Returned demonstration          PT Long Term Goals - 01/30/20 1621      PT LONG TERM GOAL #1   Title  Ind with HEP for strength  and flexibilty to prevent further injury    Time  6    Period  Weeks    Status  Achieved      PT LONG TERM GOAL #2   Title  Patient to report decreased stiffness upon standing by 75% or more.    Baseline  50% improvement    Time  6    Period  Weeks    Status  Partially Met      PT LONG TERM GOAL #3   Title  Patient to demonstrate improved core stability with seated MMT and with therex to prevent further spasms.    Baseline  5/5 strength    Time  6    Period  Weeks    Status  Achieved      PT LONG TERM GOAL #4   Title  Patient able to verbalize and demonstrate correct body mechanics to protect low back when working on his car.    Time  6    Period  Weeks    Status  Achieved            Plan - 01/30/20 1809    Clinical Impression Statement  The patient improved from 58% up to 94% on functional outcomes survey for low back.  He has met LTGs and demonstrates significantly improved posture in standing.  He is modifying his work station to use a yoga ball to engage core t/o the day and has returned to yoga for recreational activities.  PT placing patient on hold at this time and he will call if any difficulty arises as he transitions back to community activities.    Rehab Potential  Excellent    PT Frequency  2x / week    PT Duration  6 weeks    PT Treatment/Interventions  ADLs/Self Care Home Management;Cryotherapy;Electrical Stimulation;Moist Heat;Traction;Therapeutic exercise;Therapeutic activities;Neuromuscular re-education;Patient/family education;Dry needling;Taping;Spinal Manipulations    PT Next Visit Plan  discharge today.    PT Home Exercise Plan  [id number]; VHN    Consulted and Agree with Plan of Care  Patient       Patient will benefit from skilled therapeutic intervention in order to improve the following deficits and impairments:  Decreased range of motion, Pain, Impaired flexibility, Decreased strength, Postural dysfunction, Increased muscle spasms  Visit  Diagnosis: Muscle spasm of back  Muscle weakness (generalized)  Abnormal posture  Chronic bilateral  low back pain without sciatica    PHYSICAL THERAPY DISCHARGE SUMMARY  Visits from Start of Care: 9  Current functional level related to goals / functional outcomes: See goals.   Remaining deficits: Patient able to perform HEP to continue core stabilization.   Education / Equipment: HEP  Plan: Patient agrees to discharge.  Patient goals were met. Patient is being discharged due to meeting the stated rehab goals.  ?????         Thank you for the referral of this patient. Rudell Cobb, MPT  [place], [place] 01/30/2020, 6:12 PM  [facility] [place], [place], [address] Phone: [phone]   Fax:  [phone]  Name: Daniel Carpenter MRN: [id number] Date of Birth: 11/21/1955

## 2020-01-30 NOTE — Patient Instructions (Signed)
Access Code: [email or web address] URL: [email or web address] Date: 01/30/2020 Prepared by: Margretta Ditty  Exercises Seated Quadratus Lumborum Stretch with Arm Overhead - 2 x daily - 7 x weekly - 1 sets - 60 sec hold Cat Cow - 2 x daily - 7 x weekly - 10 reps - 1 sets Prone Press Up - 2 x daily - 7 x weekly - 10 reps - 1 sets Supine Piriformis Stretch - 2 x daily - 7 x weekly - 10 reps - 1 sets Supine ITB Stretch - 2 x daily - 7 x weekly - 2 reps - 1 sets - 20 seconds hold Seated Table Hamstring Stretch - 2 x daily - 7 x weekly - 2 reps - 1 sets - 20 seconds hold Standing Shoulder and Trunk Flexion at Table - 2 x daily - 7 x weekly - 1 sets - 3 reps - 20 seconds hold Single Leg Stance - 1 x daily - 7 x weekly - 3 reps - 10 seconds hold Side Plank on Elbow - 2 x daily - 7 x weekly - 1 sets - 10 reps Standard Plank - 2 x daily - 7 x weekly - 10 reps - 1 sets

## 2020-02-04 ENCOUNTER — Telehealth (INDEPENDENT_AMBULATORY_CARE_PROVIDER_SITE_OTHER): Payer: Managed Care, Other (non HMO) | Admitting: Sports Medicine

## 2020-02-04 DIAGNOSIS — M5136 Other intervertebral disc degeneration, lumbar region: Secondary | ICD-10-CM | POA: Diagnosis not present

## 2020-02-04 DIAGNOSIS — M5030 Other cervical disc degeneration, unspecified cervical region: Secondary | ICD-10-CM | POA: Diagnosis not present

## 2020-02-04 DIAGNOSIS — M170 Bilateral primary osteoarthritis of knee: Secondary | ICD-10-CM

## 2020-02-04 DIAGNOSIS — M51369 Other intervertebral disc degeneration, lumbar region without mention of lumbar back pain or lower extremity pain: Secondary | ICD-10-CM

## 2020-02-04 NOTE — Assessment & Plan Note (Signed)
Adding updated x-rays, he does have significant swelling in his left knee, he plans to come see me sometime later this week for consideration of arthrocentesis.

## 2020-02-04 NOTE — Assessment & Plan Note (Signed)
Daniel Carpenter was also having a great deal of low back pain. He has had anti-inflammatories, physical therapy, MRI. He is doing significantly better, he feels as though he is about 80% better but still has some work to do. He is not hurting enough yet to consider an epidural.

## 2020-02-04 NOTE — Progress Notes (Signed)
   Virtual Visit via WebEx/MyChart   I connected with  Daniel Carpenter  on 02/04/20 via WebEx/MyChart/Doximity Video and verified that I am speaking with the correct person using two identifiers.   I discussed the limitations, risks, security and privacy concerns of performing an evaluation and management service by WebEx/MyChart/Doximity Video, including the higher likelihood of inaccurate diagnosis and treatment, and the availability of in person appointments.  We also discussed the likely need of an additional face to face encounter for complete and high quality delivery of care.  I also discussed with the patient that there may be a patient responsible charge related to this service. The patient expressed understanding and wishes to proceed.  Provider location is either at home or medical facility. Patient location is at their home, different from provider location. People involved in care of the patient during this telehealth encounter were myself, my nurse/medical assistant, and my front office/scheduling team member.  Review of Systems: No fevers, chills, night sweats, weight loss, chest pain, or shortness of breath.   Objective Findings:    General: Speaking full sentences, no audible heavy breathing.  Sounds alert and appropriately interactive.  Appears well.  Face symmetric.  Extraocular movements intact.  Pupils equal and round.  No nasal flaring or accessory muscle use visualized.  Independent interpretation of tests performed by another provider:   None.  Brief History, Exam, Impression, and Recommendations:    DDD (degenerative disc disease), cervical Daniel Carpenter returns, his neck is doing a lot better.  Lumbar degenerative disc disease Daniel Carpenter was also having a great deal of low back pain. He has had anti-inflammatories, physical therapy, MRI. He is doing significantly better, he feels as though he is about 80% better but still has some work to do. He is not hurting enough yet to  consider an epidural.  Primary osteoarthritis of both knees Adding updated x-rays, he does have significant swelling in his left knee, he plans to come see me sometime later this week for consideration of arthrocentesis.    I discussed the above assessment and treatment plan with the patient. The patient was provided an opportunity to ask questions and all were answered. The patient agreed with the plan and demonstrated an understanding of the instructions.   The patient was advised to call back or seek an in-person evaluation if the symptoms worsen or if the condition fails to improve as anticipated.   I provided 30 minutes of face to face and non-face-to-face time during this encounter date, time was needed to gather information, review chart, records, communicate/coordinate with staff remotely, as well as complete documentation.   ___________________________________________ Daniel Carpenter. Daniel Carpenter, M.D., ABFM., CAQSM. Primary Care and Sports Medicine [place] [facility]  Adjunct Instructor of Family Medicine  [employer]

## 2020-02-04 NOTE — Assessment & Plan Note (Signed)
Daniel Carpenter returns, his neck is doing a lot better.

## 2020-02-06 ENCOUNTER — Encounter: Payer: Managed Care, Other (non HMO) | Admitting: Rehabilitative and Restorative Service Providers"

## 2020-02-07 ENCOUNTER — Ambulatory Visit (INDEPENDENT_AMBULATORY_CARE_PROVIDER_SITE_OTHER): Payer: Managed Care, Other (non HMO)

## 2020-02-07 ENCOUNTER — Other Ambulatory Visit: Payer: Self-pay

## 2020-02-07 ENCOUNTER — Ambulatory Visit (INDEPENDENT_AMBULATORY_CARE_PROVIDER_SITE_OTHER): Payer: Managed Care, Other (non HMO) | Admitting: Sports Medicine

## 2020-02-07 DIAGNOSIS — M170 Bilateral primary osteoarthritis of knee: Secondary | ICD-10-CM

## 2020-02-07 NOTE — Progress Notes (Signed)
    Procedures performed today:    Procedure: Real-time Ultrasound Guided Aspiration of left knee Device: Samsung HS60  Verbal informed consent obtained.  Time-out conducted.  Noted no overlying erythema, induration, or other signs of local infection.  Skin prepped in a sterile fashion.  Local anesthesia: Topical Ethyl chloride.  With sterile technique and under real time ultrasound guidance:  I guided an 18-gauge needle into the suprapatellar recess and aspirated 61 cc of clear, straw-colored fluid. completed without difficulty  Pain immediately resolved suggesting accurate placement of the medication.  Advised to call if fevers/chills, erythema, induration, drainage, or persistent bleeding.  Images permanently stored and available for review in the ultrasound unit.  Impression: Technically successful ultrasound guided injection.  Independent interpretation of notes and tests performed by another provider:   I personally reviewed his knee x-rays, he has bilateral end-stage knee osteoarthritis, left worse than right with bone-on-bone changes in the lateral compartment of the left knee.  Brief History, Exam, Impression, and Recommendations:    Primary osteoarthritis of both knees Daniel Carpenter returns, he has bone-on-bone osteoarthritis in his knees, left worse than right, the plan today was to do an aspiration and injection, he did just have his [employer] COVID-19 vaccine today. We only aspirated the left knee. He will continue meloxicam and tramadol for breakthrough pain, and revisit this in 2 weeks at which point we will consider an injection in both knees.    ___________________________________________ [name]. Benjamin Stain, M.D., ABFM., CAQSM. Primary Care and Sports Medicine  [facility]  Adjunct Instructor of Family Medicine  [employer]

## 2020-02-07 NOTE — Assessment & Plan Note (Signed)
Daniel Carpenter returns, he has bone-on-bone osteoarthritis in his knees, left worse than right, the plan today was to do an aspiration and injection, he did just have his [employer] COVID-19 vaccine today. We only aspirated the left knee. He will continue meloxicam and tramadol for breakthrough pain, and revisit this in 2 weeks at which point we will consider an injection in both knees.

## 2020-02-21 ENCOUNTER — Encounter: Payer: Self-pay | Admitting: Sports Medicine

## 2020-02-21 ENCOUNTER — Ambulatory Visit (INDEPENDENT_AMBULATORY_CARE_PROVIDER_SITE_OTHER): Payer: Managed Care, Other (non HMO) | Admitting: Sports Medicine

## 2020-02-21 ENCOUNTER — Other Ambulatory Visit: Payer: Self-pay

## 2020-02-21 DIAGNOSIS — M170 Bilateral primary osteoarthritis of knee: Secondary | ICD-10-CM | POA: Diagnosis not present

## 2020-02-21 NOTE — Progress Notes (Signed)
    Procedures performed today:    Procedure: Real-time Ultrasound Guided aspiration/injection of left knee Device: Samsung HS60  Verbal informed consent obtained.  Time-out conducted.  Noted no overlying erythema, induration, or other signs of local infection.  Skin prepped in a sterile fashion.  Local anesthesia: Topical Ethyl chloride.  With sterile technique and under real time ultrasound guidance:  Using 18-gauge needle aspirated 70 cc of clear, straw-colored fluid, syringe switched and 1 cc Kenalog 40, 2 cc lidocaine, 2 cc bupivacaine injected easily Completed without difficulty  Pain immediately resolved suggesting accurate placement of the medication.  Advised to call if fevers/chills, erythema, induration, drainage, or persistent bleeding.  Images permanently stored and available for review in the ultrasound unit.  Impression: Technically successful ultrasound guided injection.  Independent interpretation of notes and tests performed by another provider:   None.  Brief History, Exam, Impression, and Recommendations:    Primary osteoarthritis of both knees Daniel Carpenter returns, he does have bone-on-bone osteoarthritis in his knees, left worse than right, his right knee is doing okay, he is over 2 weeks post his [employer] COVID-19 vaccine, at the last visit we aspirated his knee but avoided the injection, today we did an aspiration and injection. Return to see me in a month. His predominant complaint is effusion rather than pain.    ___________________________________________ Daniel Carpenter. Benjamin Stain, M.D., ABFM., CAQSM. Primary Care and Sports Medicine [place] [facility]  Adjunct Instructor of Family Medicine  [employer]

## 2020-02-21 NOTE — Assessment & Plan Note (Signed)
Daniel Carpenter returns, he does have bone-on-bone osteoarthritis in his knees, left worse than right, his right knee is doing okay, he is over 2 weeks post his [employer] COVID-19 vaccine, at the last visit we aspirated his knee but avoided the injection, today we did an aspiration and injection. Return to see me in a month. His predominant complaint is effusion rather than pain.

## 2020-03-23 ENCOUNTER — Ambulatory Visit (INDEPENDENT_AMBULATORY_CARE_PROVIDER_SITE_OTHER): Payer: Managed Care, Other (non HMO) | Admitting: Sports Medicine

## 2020-03-23 ENCOUNTER — Encounter: Payer: Self-pay | Admitting: Sports Medicine

## 2020-03-23 ENCOUNTER — Other Ambulatory Visit: Payer: Self-pay

## 2020-03-23 DIAGNOSIS — M170 Bilateral primary osteoarthritis of knee: Secondary | ICD-10-CM

## 2020-03-23 DIAGNOSIS — M1612 Unilateral primary osteoarthritis, left hip: Secondary | ICD-10-CM

## 2020-03-23 NOTE — Progress Notes (Signed)
    Procedures performed today:    None.  Independent interpretation of notes and tests performed by another provider:   None.  Brief History, Exam, Impression, and Recommendations:    Primary osteoarthritis of left hip Daniel Carpenter was having some pain in his left groin, consistent with hip OA, this is improving since he switched shoes.  Primary osteoarthritis of both knees This is a pleasant 64 year old male, he has bone-on-bone osteoarthritis in his knees, left worse than right, right knee was doing well, at the last visit we aspirated and injected his left knee, symptoms of all but completely resolved. He did switch shoes to a newer pair and has continued to do well, he also has some carbon fiber orthotics that he wears as well. I did give him a couple of pairs of lateral posting wedges for his heels that he can place in his shoes if needed. It sounds as though his older shoes placed him in too much supination and this overloaded his medial compartment. He can return to see me as needed.    ___________________________________________ [name]. Benjamin Stain, M.D., ABFM., CAQSM. Primary Care and Sports Medicine [place] [facility]  Adjunct Instructor of Family Medicine  [employer]

## 2020-03-23 NOTE — Assessment & Plan Note (Addendum)
This is a pleasant 64 year old male, he has bone-on-bone osteoarthritis in his knees, left worse than right, right knee was doing well, at the last visit we aspirated and injected his left knee, symptoms of all but completely resolved. He did switch shoes to a newer pair and has continued to do well, he also has some carbon fiber orthotics that he wears as well. I did give him a couple of pairs of lateral posting wedges for his heels that he can place in his shoes if needed. It sounds as though his older shoes placed him in too much supination and this overloaded his medial compartment. He can return to see me as needed.

## 2020-03-23 NOTE — Assessment & Plan Note (Signed)
Daniel Carpenter was having some pain in his left groin, consistent with hip OA, this is improving since he switched shoes.

## 2020-10-15 ENCOUNTER — Encounter: Payer: Self-pay | Admitting: Osteopathic Medicine

## 2020-10-15 DIAGNOSIS — Z0000 Encounter for general adult medical examination without abnormal findings: Secondary | ICD-10-CM

## 2020-10-21 ENCOUNTER — Ambulatory Visit (INDEPENDENT_AMBULATORY_CARE_PROVIDER_SITE_OTHER): Payer: Managed Care, Other (non HMO) | Admitting: Osteopathic Medicine

## 2020-10-21 ENCOUNTER — Encounter: Payer: Self-pay | Admitting: Osteopathic Medicine

## 2020-10-21 VITALS — BP 124/80 | HR 58 | Temp 98.1°F | Wt 198.8 lb

## 2020-10-21 DIAGNOSIS — Z0000 Encounter for general adult medical examination without abnormal findings: Secondary | ICD-10-CM

## 2020-10-21 LAB — COMPLETE METABOLIC PANEL WITH GFR
AG Ratio: 1.8 (calc) (ref 1.0–2.5)
ALT: 26 U/L (ref 9–46)
AST: 20 U/L (ref 10–35)
Albumin: 4.4 g/dL (ref 3.6–5.1)
Alkaline phosphatase (APISO): 56 U/L (ref 35–144)
BUN: 19 mg/dL (ref 7–25)
CO2: 32 mmol/L (ref 20–32)
Calcium: 9.4 mg/dL (ref 8.6–10.3)
Chloride: 103 mmol/L (ref 98–110)
Creat: 1.06 mg/dL (ref 0.70–1.25)
GFR, Est African American: 86 mL/min/{1.73_m2} (ref >=60)
GFR, Est Non African American: 74 mL/min/{1.73_m2} (ref >=60)
Globulin: 2.4 g/dL (calc) (ref 1.9–3.7)
Glucose, Bld: 112 mg/dL — ABNORMAL HIGH (ref 65–99)
Potassium: 4.6 mmol/L (ref 3.5–5.3)
Sodium: 140 mmol/L (ref 135–146)
Total Bilirubin: 0.5 mg/dL (ref 0.2–1.2)
Total Protein: 6.8 g/dL (ref 6.1–8.1)

## 2020-10-21 LAB — CBC
HCT: 46.5 % (ref 38.5–50.0)
Hemoglobin: 16.1 g/dL (ref 13.2–17.1)
MCH: 31.8 pg (ref 27.0–33.0)
MCHC: 34.6 g/dL (ref 32.0–36.0)
MCV: 91.9 fL (ref 80.0–100.0)
MPV: 10 fL (ref 7.5–12.5)
Platelets: 240 10*3/uL (ref 140–400)
RBC: 5.06 10*6/uL (ref 4.20–5.80)
RDW: 12.3 % (ref 11.0–15.0)
WBC: 4 10*3/uL (ref 3.8–10.8)

## 2020-10-21 LAB — LIPID PANEL
Cholesterol: 150 mg/dL (ref <200)
HDL: 63 mg/dL (ref >=40)
LDL Cholesterol (Calc): 67 mg/dL (calc)
Non-HDL Cholesterol (Calc): 87 mg/dL (calc) (ref <130)
Total CHOL/HDL Ratio: 2.4 (calc) (ref <5.0)
Triglycerides: 115 mg/dL (ref <150)

## 2020-10-21 LAB — PSA, TOTAL WITH REFLEX TO PSA, FREE: PSA, Total: 1.3 ng/mL (ref <=4.0)

## 2020-10-21 LAB — HEMOGLOBIN A1C W/OUT EAG: Hgb A1c MFr Bld: 5.7 % of total Hgb — ABNORMAL HIGH (ref <5.7)

## 2020-10-21 NOTE — Patient Instructions (Signed)
General Preventive Care  Most recent routine screening labs: see attached.   Blood pressure goal 130/80 or less.   Tobacco: don't!   Alcohol: responsible moderation is ok for most adults - if you have concerns about your alcohol intake, please talk to me!   Exercise: as tolerated to reduce risk of cardiovascular disease and diabetes. Strength training will also prevent osteoporosis.   Mental health: if need for mental health care (medicines, counseling, other), or concerns about moods, please let me know!   Sexual / Reproductive health: if need for STD testing, or if concerns with libido/pain problems, please let me know!  Advanced Directive: Living Will and/or Healthcare Power of Attorney recommended for all adults, regardless of age or health.  Vaccines  Flu vaccine: recommended every fall.   Shingles vaccine: after age 56.   Pneumonia vaccines: boost after age 61.  Tetanus booster: every 10 years - due 2028  COVID vaccine: THANKS for getting your vaccine and booster! :)  Cancer screenings   Colon cancer screening: for everyone age 8-75. Colonoscopy available for all, many people also qualify for the Cologuard stool test   Prostate cancer screening: PSA blood test age 61-71  Lung cancer screening: not needed for non-smokers  Infection screenings  . HIV: recommended screening at least once age 55-65, more often as needed. . Gonorrhea/Chlamydia: screening as needed . Hepatitis C: recommended once for everyone age 83-75 . TB: certain at-risk populations, or depending on work requirements and/or travel history Other . Bone Density Test: recommended for men at age 18 . Abdominal Aortic Aneurysm: screening with ultrasound recommended once for men age 45-75 who have ever smoked

## 2020-10-21 NOTE — Progress Notes (Signed)
Daniel Carpenter is a 64 y.o. male who presents to  [facility] at [facility]  today, 10/21/20, seeking care for the following:  . Annual physical      ASSESSMENT & PLAN with other pertinent findings:  The encounter diagnosis was Annual physical exam.   No results found for this or any previous visit (from the past 24 hour(s)).     Patient Instructions  General Preventive Care  Most recent routine screening labs: see attached.   Blood pressure goal 130/80 or less.   Tobacco: don't!   Alcohol: responsible moderation is ok for most adults - if you have concerns about your alcohol intake, please talk to me!   Exercise: as tolerated to reduce risk of cardiovascular disease and diabetes. Strength training will also prevent osteoporosis.   Mental health: if need for mental health care (medicines, counseling, other), or concerns about moods, please let me know!   Sexual / Reproductive health: if need for STD testing, or if concerns with libido/pain problems, please let me know!  Advanced Directive: Living Will and/or Healthcare Power of Attorney recommended for all adults, regardless of age or health.  Vaccines  Flu vaccine: recommended every fall.   Shingles vaccine: after age 20.   Pneumonia vaccines: boost after age 42.  Tetanus booster: every 10 years - due 2028  COVID vaccine: THANKS for getting your vaccine and booster! :)  Cancer screenings   Colon cancer screening: for everyone age 76-75. Colonoscopy available for all, many people also qualify for the Cologuard stool test   Prostate cancer screening: PSA blood test age 39-71  Lung cancer screening: not needed for non-smokers  Infection screenings  . HIV: recommended screening at least once age 46-65, more often as needed. . Gonorrhea/Chlamydia: screening as needed . Hepatitis C: recommended once for everyone age 104-75 . TB: certain at-risk populations, or depending  on work requirements and/or travel history Other . Bone Density Test: recommended for men at age 3 . Abdominal Aortic Aneurysm: screening with ultrasound recommended once for men age 46-75 who have ever smoked    No orders of the defined types were placed in this encounter.   No orders of the defined types were placed in this encounter.    The 10-year ASCVD risk score Denman George DC Montez Hageman., et al., 2013) is: 7.2%   Values used to calculate the score:     Age: 72 years     Sex: Male     Is Non-Hispanic African American: No     Diabetic: No     Tobacco smoker: No     Systolic Blood Pressure: 124 mmHg     Is BP treated: No     HDL Cholesterol: 63 mg/dL     Total Cholesterol: 150 mg/dL   Constitutional:  . VSS, see nurse notes . General Appearance: alert, well-developed, well-nourished, NAD Neck: . No masses, trachea midline . No thyroid enlargement/tenderness/mass appreciated Respiratory: . Normal respiratory effort . Breath sounds normal, no wheeze/rhonchi/rales Cardiovascular: . S1/S2 normal, no murmur/rub/gallop auscultated . No lower extremity edema Gastrointestinal: . Nontender, no masses . No hepatomegaly, no splenomegaly . No hernia appreciated Musculoskeletal:  . Gait normal . No clubbing/cyanosis of digits Neurological: . No cranial nerve deficit on limited exam . Motor and sensation intact and symmetric Psychiatric: . Normal judgment/insight . Normal mood and affect   Follow-up instructions: Return in about 1 year (around 10/21/2021) for [place] (call week prior to visit for lab  orders).                                         BP 124/80   Pulse (!) 58   Temp 98.1 F (36.7 C)   Wt 198 lb 12.8 oz (90.2 kg)   SpO2 96%   BMI 27.73 kg/m   Current Meds  Medication Sig  . cholecalciferol (VITAMIN D) 1000 UNITS tablet Take 1 tablet (1,000 Units total) by mouth daily.  . fish oil-omega-3 fatty acids 1000 MG capsule Take 2  capsules (2 g total) by mouth daily.  . Glucosamine-Chondroit-Vit C-Mn (GLUCOSAMINE 1500 COMPLEX) CAPS Take 2 capsules by mouth daily.  . meloxicam (MOBIC) 15 MG tablet Take 1 tablet (15 mg total) by mouth daily as needed for pain.  . simvastatin (ZOCOR) 40 MG tablet Take 1 tablet (40 mg total) by mouth at bedtime.  . vitamin B-12 (CYANOCOBALAMIN) 500 MCG tablet Take 1 tablet (500 mcg total) by mouth daily.  . vitamin C (ASCORBIC ACID) 500 MG tablet Take 2 tablets (1,000 mg total) by mouth daily.    Results for orders placed or performed in visit on 10/15/20 (from the past 72 hour(s))  CBC     Status: None   Collection Time: 10/19/20  8:30 AM  Result Value Ref Range   WBC 4.0 3.8 - 10.8 Thousand/uL   RBC 5.06 4.20 - 5.80 Million/uL   Hemoglobin 16.1 13.2 - 17.1 g/dL   HCT 81.8 56.3 - 14.9 %   MCV 91.9 80.0 - 100.0 fL   MCH 31.8 27.0 - 33.0 pg   MCHC 34.6 32.0 - 36.0 g/dL   RDW 70.2 63.7 - 85.8 %   Platelets 240 140 - 400 Thousand/uL   MPV 10.0 7.5 - 12.5 fL  COMPLETE METABOLIC PANEL WITH GFR     Status: Abnormal   Collection Time: 10/19/20  8:30 AM  Result Value Ref Range   Glucose, Bld 112 (H) 65 - 99 mg/dL    Comment: .            Fasting reference interval . For someone without known diabetes, a glucose value between 100 and 125 mg/dL is consistent with prediabetes and should be confirmed with a follow-up test. .    BUN 19 7 - 25 mg/dL   Creat 8.50 2.77 - 4.12 mg/dL    Comment: For patients >31 years of age, the reference limit for Creatinine is approximately 13% higher for people identified as African-American. .    GFR, Est Non African American 74 > OR = 60 mL/min/1.46m2   GFR, Est African American 86 > OR = 60 mL/min/1.102m2   BUN/Creatinine Ratio NOT APPLICABLE 6 - 22 (calc)   Sodium 140 135 - 146 mmol/L   Potassium 4.6 3.5 - 5.3 mmol/L   Chloride 103 98 - 110 mmol/L   CO2 32 20 - 32 mmol/L   Calcium 9.4 8.6 - 10.3 mg/dL   Total Protein 6.8 6.1 - 8.1 g/dL    Albumin 4.4 3.6 - 5.1 g/dL   Globulin 2.4 1.9 - 3.7 g/dL (calc)   AG Ratio 1.8 1.0 - 2.5 (calc)   Total Bilirubin 0.5 0.2 - 1.2 mg/dL   Alkaline phosphatase (APISO) 56 35 - 144 U/L   AST 20 10 - 35 U/L   ALT 26 9 - 46 U/L  Lipid panel     Status: None  Collection Time: 10/19/20  8:30 AM  Result Value Ref Range   Cholesterol 150 <200 mg/dL   HDL 63 > OR = 40 mg/dL   Triglycerides 295 <188 mg/dL   LDL Cholesterol (Calc) 67 mg/dL (calc)    Comment: Reference range: <100 . Desirable range <100 mg/dL for primary prevention;   <70 mg/dL for patients with CHD or diabetic patients  with > or = 2 CHD risk factors. Marland Kitchen LDL-C is now calculated using the Martin-Hopkins  calculation, which is a validated novel method providing  better accuracy than the Friedewald equation in the  estimation of LDL-C.  Horald Pollen et al. Lenox Ahr. 4166;063(01): 2061-2068  (http://education.[facility].com/faq/FAQ164)    Total CHOL/HDL Ratio 2.4 <5.0 (calc)   Non-HDL Cholesterol (Calc) 87 <601 mg/dL (calc)    Comment: For patients with diabetes plus 1 major ASCVD risk  factor, treating to a non-HDL-C goal of <100 mg/dL  (LDL-C of <09 mg/dL) is considered a therapeutic  option.   PSA, Total with Reflex to PSA, Free     Status: None   Collection Time: 10/19/20  8:30 AM  Result Value Ref Range   PSA, Total 1.3 < OR = 4.0 ng/mL    Comment: The Total PSA value from this assay system is  standardized against the equimolar PSA standard.  The test result will be approximately 20% higher  when compared to the [facility] Total PSA  (Siemens assay). Comparison of serial PSA results  should be interpreted with this fact in mind. Marland Kitchen PSA was performed using the Beckman Coulter  Immunoassay method. Values obtained from different  assay methods cannot be used interchangeably. PSA  levels, regardless of value, should not be  interpreted as absolute evidence of the presence or  absence of disease.   Hemoglobin  A1C w/out eAG     Status: Abnormal   Collection Time: 10/19/20  8:30 AM  Result Value Ref Range   Hgb A1c MFr Bld 5.7 (H) <5.7 % of total Hgb    Comment: For someone without known diabetes, a hemoglobin  A1c value between 5.7% and 6.4% is consistent with prediabetes and should be confirmed with a  follow-up test. . For someone with known diabetes, a value <7% indicates that their diabetes is well controlled. A1c targets should be individualized based on duration of diabetes, age, comorbid conditions, and other considerations. . This assay result is consistent with an increased risk of diabetes. . Currently, no consensus exists regarding use of hemoglobin A1c for diagnosis of diabetes for children. .     No results found.     All questions at time of visit were answered - patient instructed to contact office with any additional concerns or updates.  ER/RTC precautions were reviewed with the patient as applicable.   Please note: voice recognition software was used to produce this document, and typos may escape review. Please contact Dr. Lyn Hollingshead for any needed clarifications.

## 2020-11-06 ENCOUNTER — Other Ambulatory Visit: Payer: Self-pay | Admitting: Sports Medicine

## 2020-11-06 MED ORDER — SIMVASTATIN 40 MG PO TABS: 40.0000 mg | ORAL_TABLET | Freq: Every day | ORAL | 3 refills | Status: DC

## 2020-11-17 ENCOUNTER — Encounter: Payer: Managed Care, Other (non HMO) | Admitting: Osteopathic Medicine

## 2020-12-04 IMAGING — DX DG KNEE COMPLETE 4+V*R*
4 series · 4 of 4 positions shown · non-contrast
Comparison: None.

CLINICAL DATA: Left knee swelling x7 weeks with history of right
knee scope.

EXAM:
RIGHT KNEE - COMPLETE 4+ VIEW

[tunnel]
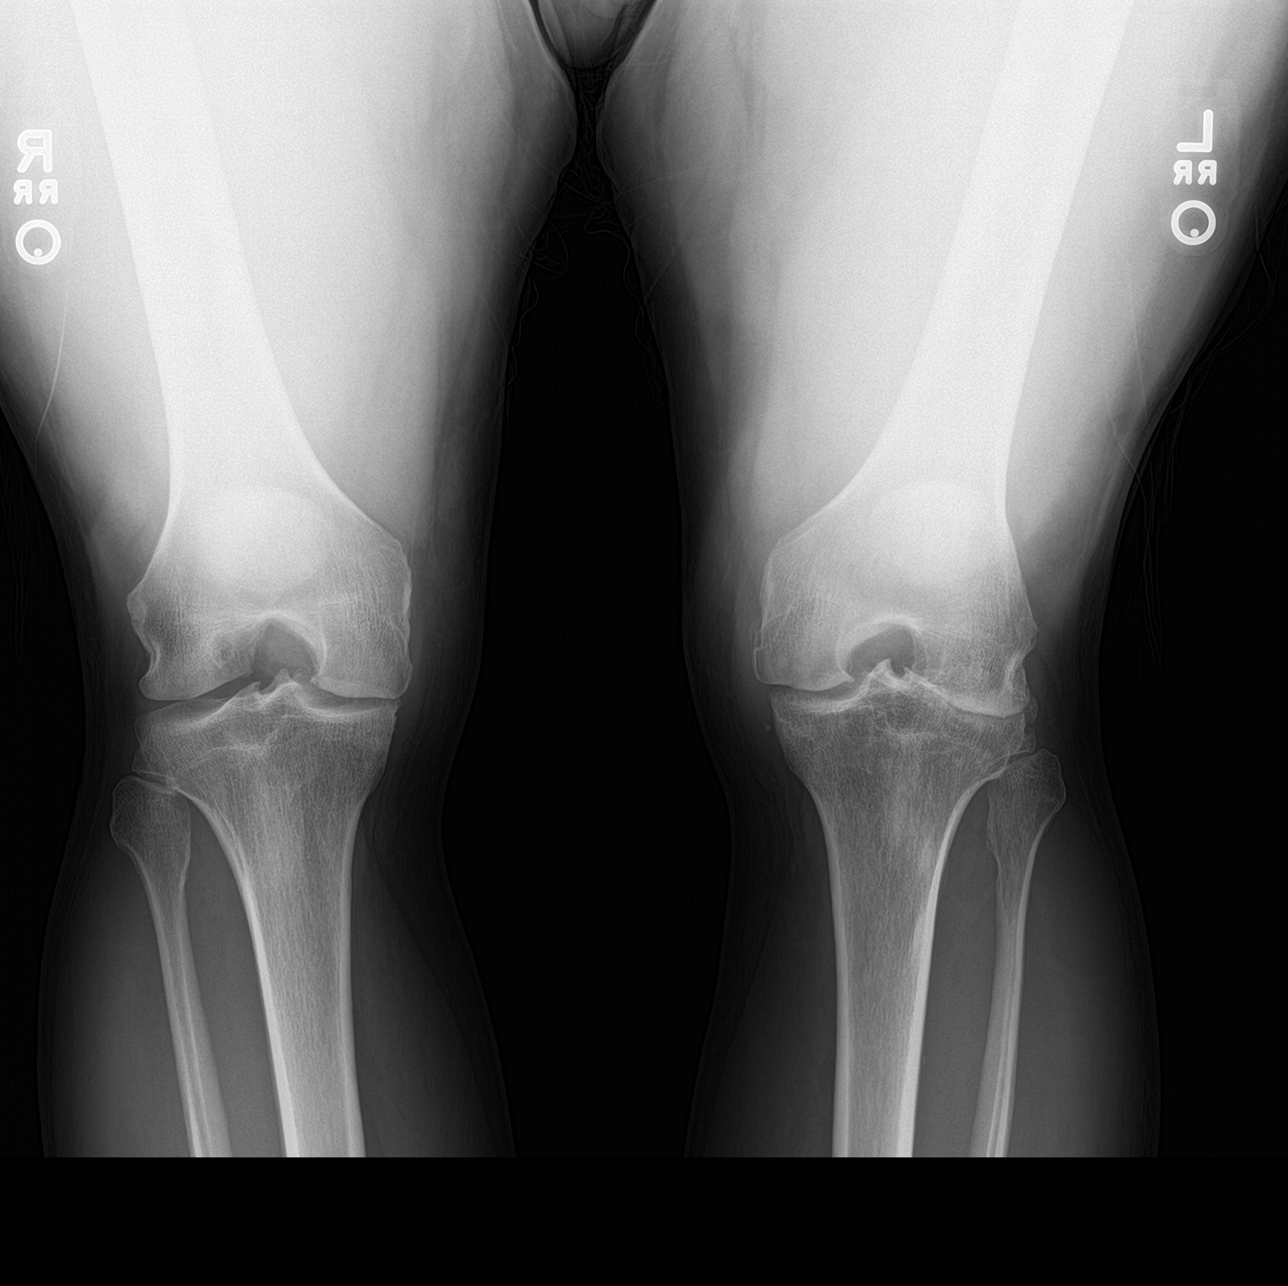

[knee lat]
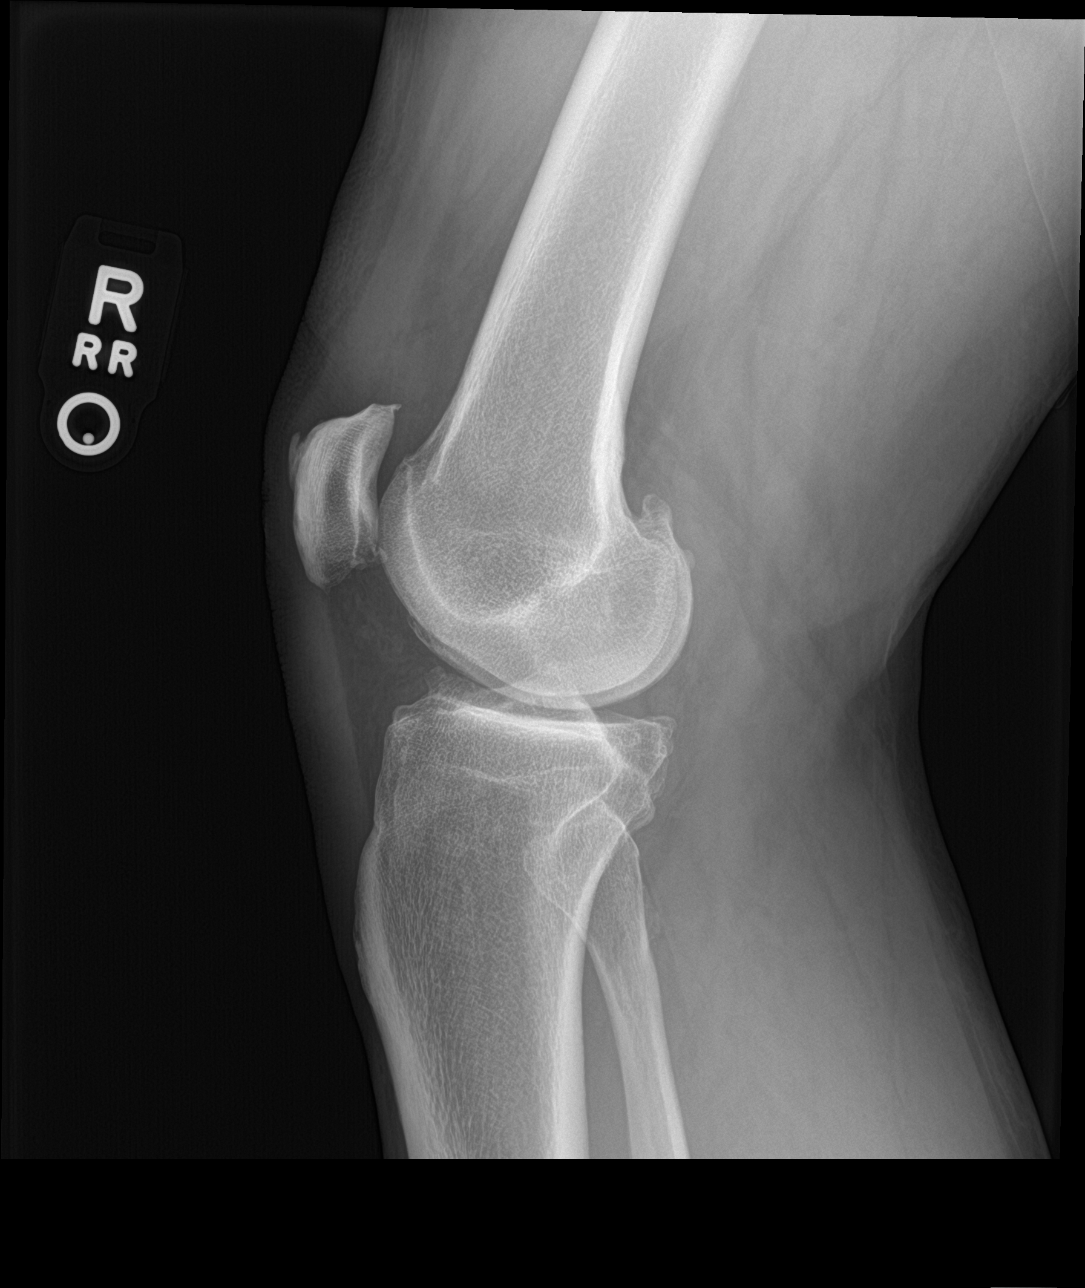

[knee sunrise]
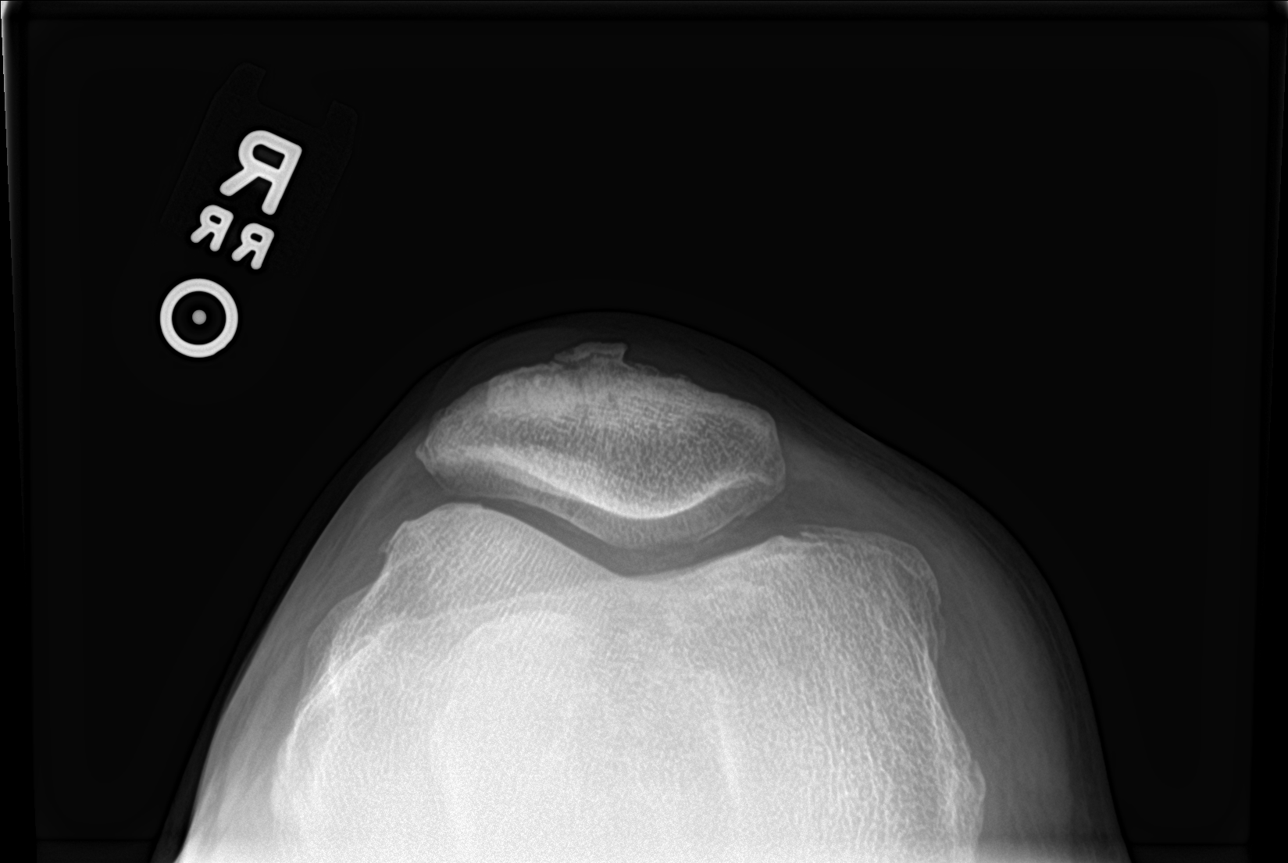

[knee ap bilat standing]
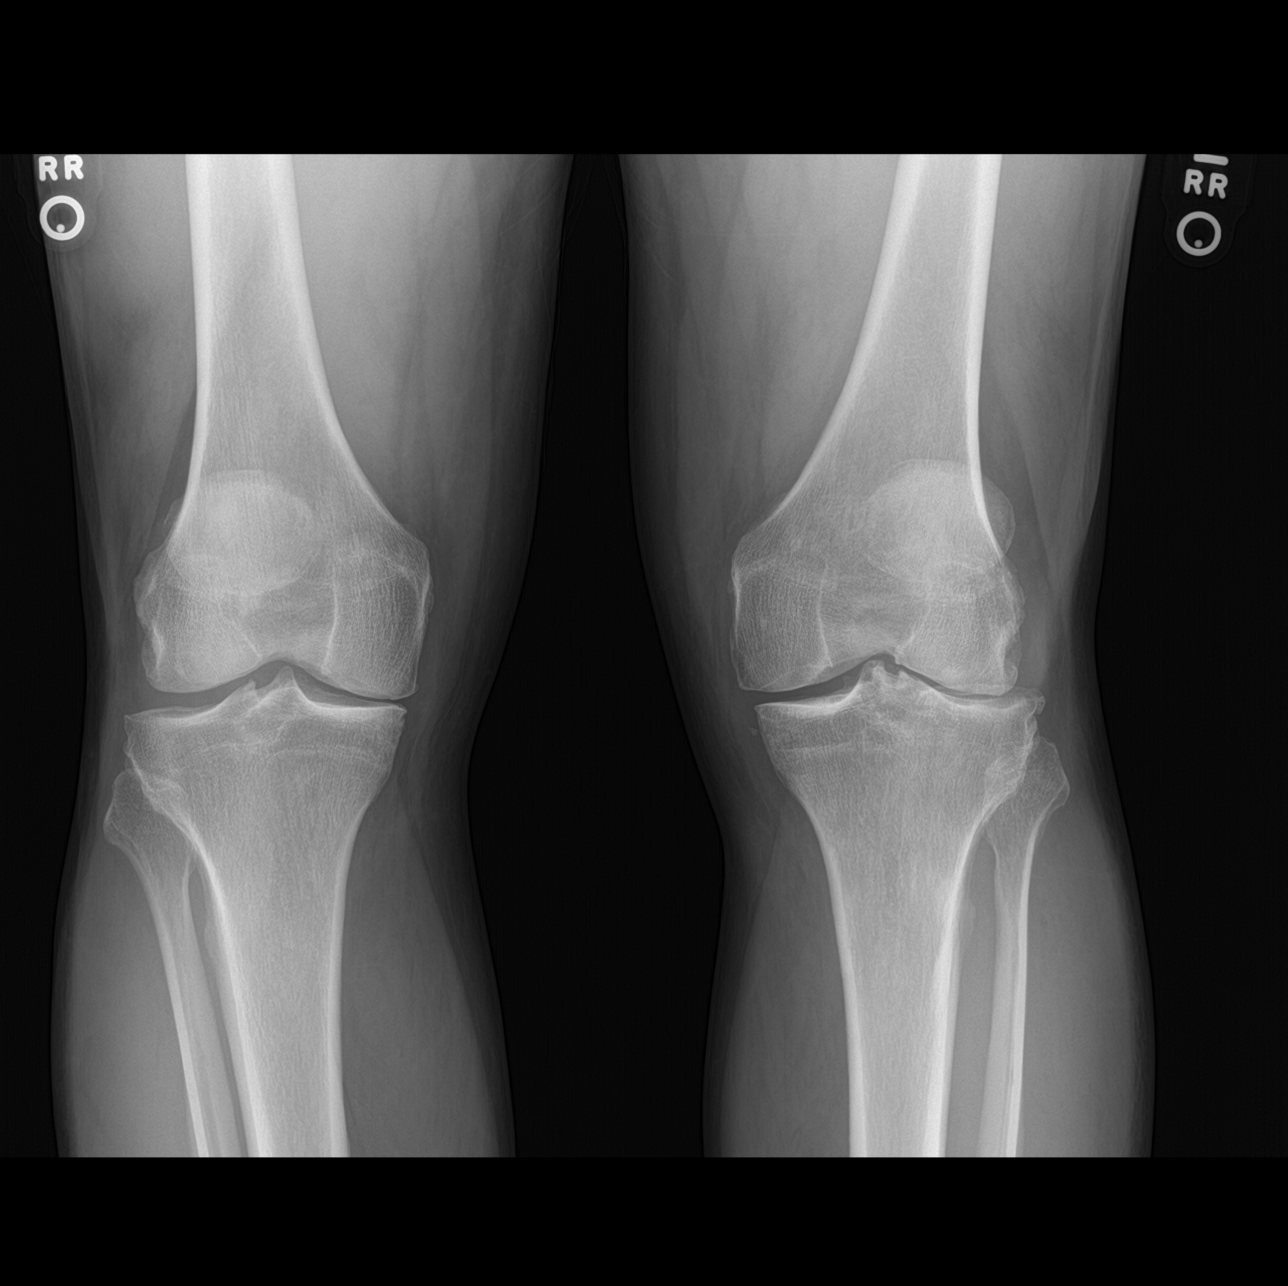

[4 of 4 positions shown; findings below may reference images not displayed]

FINDINGS: No evidence of an acute fracture, dislocation, or joint effusion.
There is moderate severity narrowing of the medial tibiofemoral
compartment space. Mild lateral tibiofemoral compartment space
narrowing is also seen. Soft tissues are unremarkable.
IMPRESSION: Moderate severity degenerative changes.

## 2020-12-04 IMAGING — DX DG KNEE COMPLETE 4+V*L*
4 series · 4 of 4 positions shown · non-contrast
Comparison: None.

CLINICAL DATA: Left knee swelling x7 weeks.

EXAM:
LEFT KNEE - COMPLETE 4+ VIEW

[tunnel]
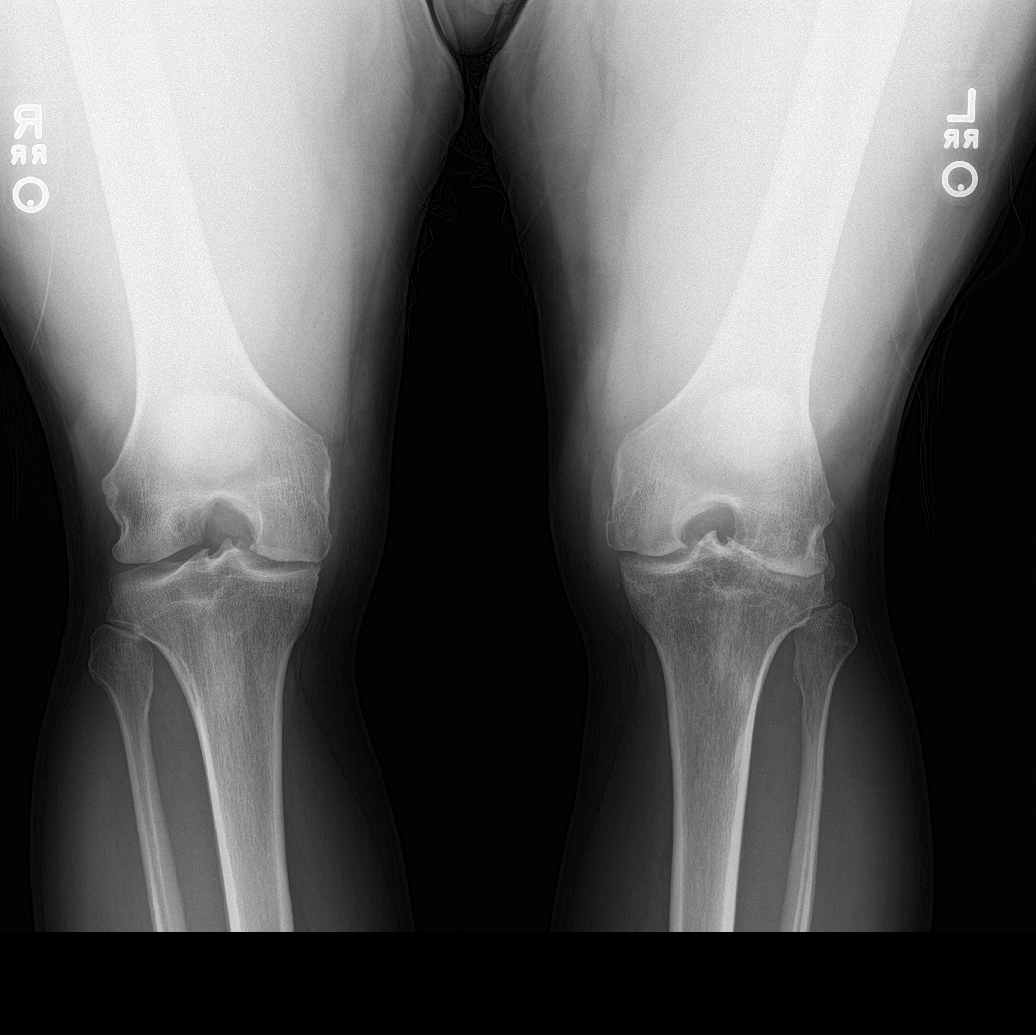

[knee lat]
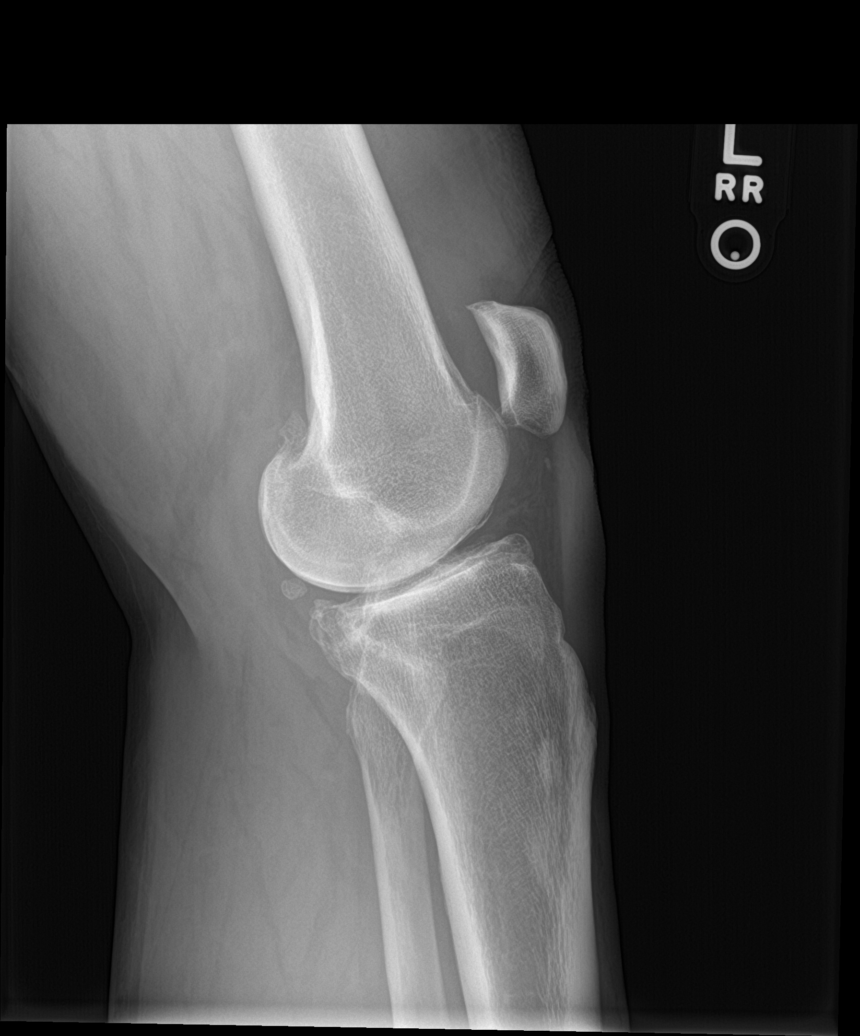

[knee sunrise]
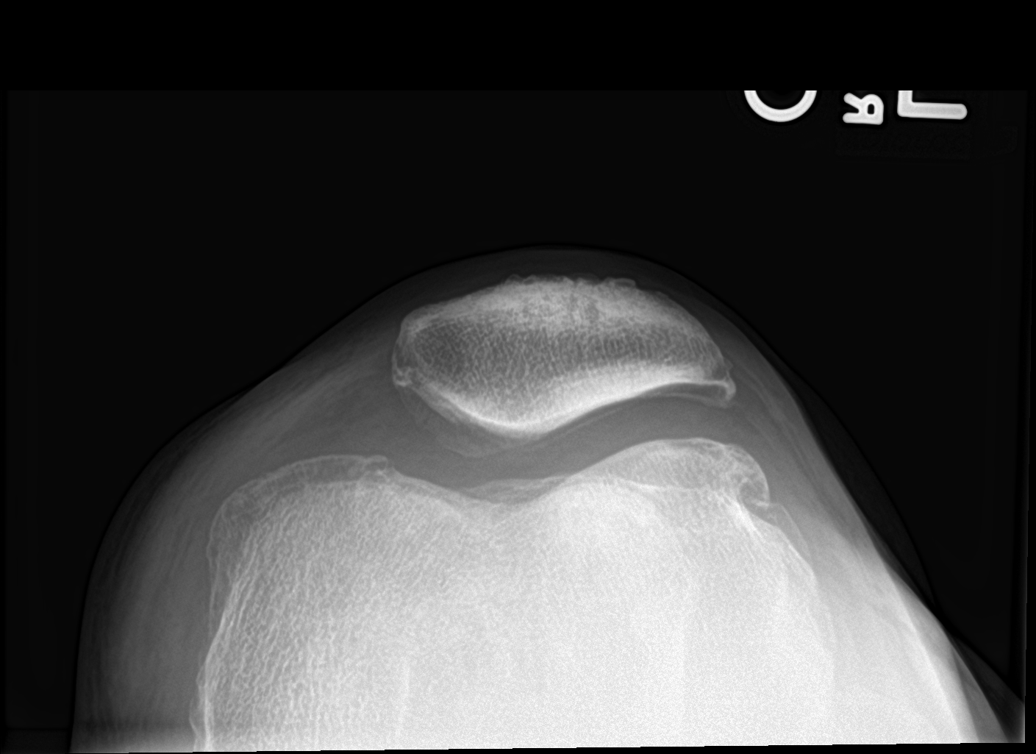

[knee ap bilat standing]
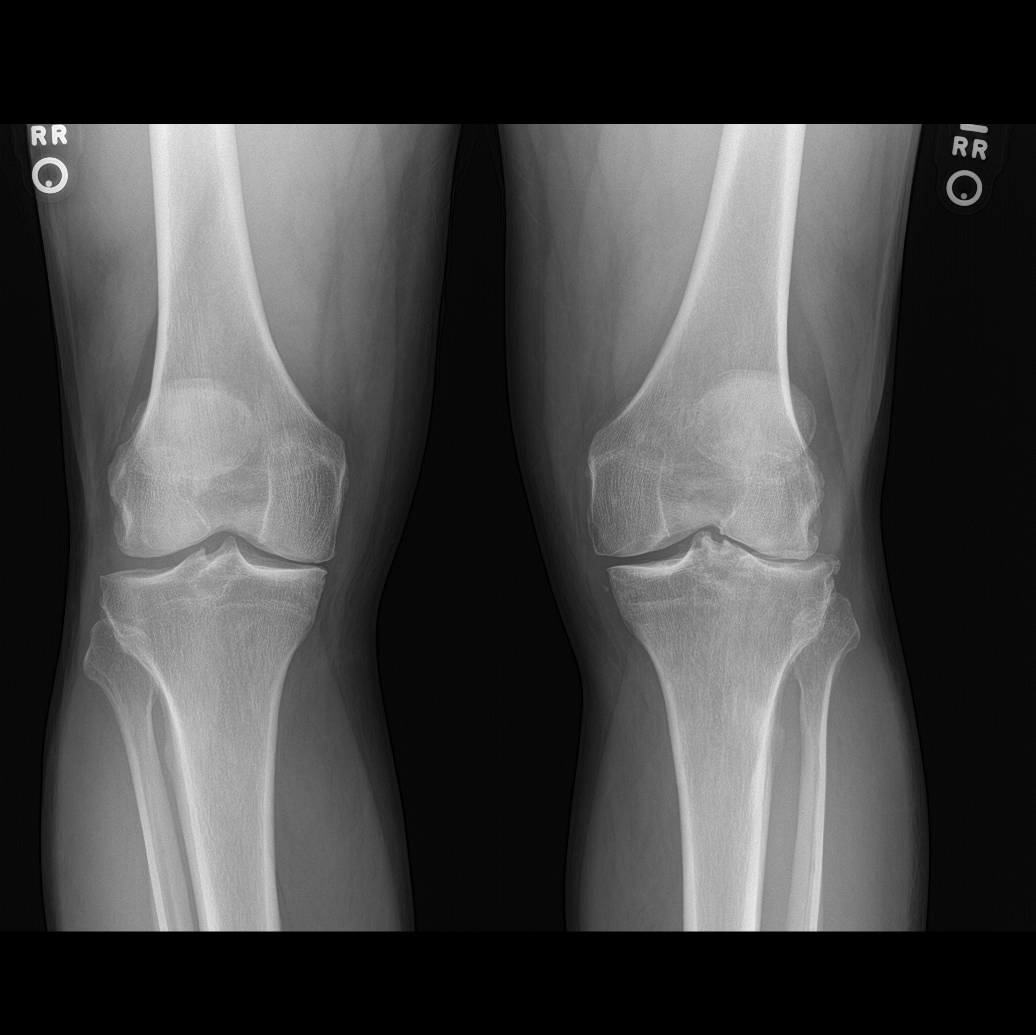

[4 of 4 positions shown; findings below may reference images not displayed]

FINDINGS: No evidence of an acute fracture, dislocation, or joint effusion.
There is marked severity narrowing of the lateral tibiofemoral
compartment space. Moderate severity medial tibiofemoral compartment
space narrowing is also seen. Soft tissues are unremarkable.
IMPRESSION: 1. Moderate to marked severity degenerative changes.

## 2021-02-10 MED ORDER — MELOXICAM 15 MG PO TABS: 15.0000 mg | ORAL_TABLET | Freq: Every day | ORAL | 3 refills | Status: DC | PRN

## 2021-05-03 ENCOUNTER — Encounter: Payer: Self-pay | Admitting: Osteopathic Medicine

## 2021-05-06 DIAGNOSIS — D1721 Benign lipomatous neoplasm of skin and subcutaneous tissue of right arm: Secondary | ICD-10-CM | POA: Diagnosis not present

## 2021-05-10 ENCOUNTER — Other Ambulatory Visit: Payer: Self-pay

## 2021-05-10 ENCOUNTER — Encounter: Payer: Self-pay | Admitting: Osteopathic Medicine

## 2021-05-10 ENCOUNTER — Ambulatory Visit (INDEPENDENT_AMBULATORY_CARE_PROVIDER_SITE_OTHER): Payer: BC Managed Care – PPO | Admitting: Osteopathic Medicine

## 2021-05-10 VITALS — BP 128/81 | HR 70 | Ht 71.0 in | Wt 193.0 lb

## 2021-05-10 DIAGNOSIS — K429 Umbilical hernia without obstruction or gangrene: Secondary | ICD-10-CM

## 2021-05-10 NOTE — Patient Instructions (Addendum)
Referral has been ordered! Please let us know if you haven't heard back within a week or so about scheduling an appointment. Someone should reach out to you. If you don't hear anything, please call our office and ask to speak to our referral coordinator!

## 2021-05-10 NOTE — Progress Notes (Signed)
Daniel Carpenter is a 65 y.o. male who presents to  [facility] at [facility]  today, 05/10/21, seeking care for the following:  Seeking surgical referral for umbilical hernia      ASSESSMENT & PLAN with other pertinent findings:  The encounter diagnosis was Umbilical hernia without obstruction and without gangrene.    Patient Instructions  Referral has been ordered! Please let us know if you haven't heard back within a week or so about scheduling an appointment. Someone should reach out to you. If you don't hear anything, please call our office and ask to speak to our referral coordinator!   Orders Placed This Encounter  Procedures   Ambulatory referral to General Surgery     No orders of the defined types were placed in this encounter.    See below for relevant physical exam findings  See below for recent lab and imaging results reviewed  Medications, allergies, PMH, PSH, SocH, FamH reviewed below    Follow-up instructions: Return for [facility] 09/2021 (call week prior to visit for lab orders).                                        Exam:  BP 128/81   Pulse 70   Ht 5\' 11"  (1.803 m)   Wt 193 lb (87.5 kg)   BMI 26.92 kg/m  Constitutional: VS see above. General Appearance: alert, well-developed, well-nourished, NAD Neck: No masses, trachea midline.  Respiratory: Normal respiratory effort. no wheeze, no rhonchi, no rales Cardiovascular: S1/S2 normal, no murmur, no rub/gallop auscultated. RRR.  Musculoskeletal: Gait normal. Symmetric and independent movement of all extremities Abdominal: non-tender, non-distended, no appreciable organomegaly, neg '. Umbilical hernia, soft, nontender.  Neurological: Normal balance/coordination. No tremor. Skin: warm, dry, intact.  Psychiatric: Normal judgment/insight. Normal mood and affect. Oriented x3.   No outpatient medications have been marked as  taking for the 05/10/21 encounter (Office Visit) with 07/11/21, DO.    No Known Allergies  Patient Active Problem List   Diagnosis Date Noted   Primary osteoarthritis of left hip 03/23/2020   DDD (degenerative disc disease), cervical 12/18/2019   Lumbar degenerative disc disease 12/18/2019   Umbilical hernia without obstruction and without gangrene 10/03/2018   Hypercholesteremia 09/08/2016   Primary osteoarthritis of both knees 09/08/2016   Blood in semen 04/22/2014   Physical exam, annual 08/29/2012   Obstructive sleep apnea 05/16/2008   ACTINIC KERATOSIS 05/16/2008   Impaired fasting glucose 05/15/2008    Family History  Problem Relation Age of Onset   Emphysema Father    Heart disease Father    Heart attack Mother    Heart disease Mother    Diabetes Sister     Social History   Tobacco Use  Smoking Status Never  Smokeless Tobacco Never    Past Surgical History:  Procedure Laterality Date   NASAL SEPTUM SURGERY  1998   ROTATOR CUFF REPAIR  2010   right   VASECTOMY      Immunization History  Administered Date(s) Administered   Influenza Split 08/04/2011, 08/23/2012, 08/21/2013   Influenza,inj,Quad PF,6+ Mos 09/08/2014, 09/09/2015, 09/08/2016, 09/27/2017, 10/03/2018, 08/26/2019   Janssen (J&J) SARS-COV-2 Vaccination 02/07/2020, 10/07/2020   Pneumococcal Conjugate-13 10/03/2018   Tdap 09/27/2017    No results found for this or any previous visit (from the past 2160 hour(s)).  No results found.  All questions at time of visit were answered - patient instructed to contact office with any additional concerns or updates. ER/RTC precautions were reviewed with the patient as applicable.   Please note: manual typing as well as voice recognition software may have been used to produce this document - typos may escape review. Please contact Dr. Sheppard Coil for any needed clarifications.

## 2021-05-24 ENCOUNTER — Encounter: Payer: Self-pay | Admitting: Osteopathic Medicine

## 2021-07-13 DIAGNOSIS — K429 Umbilical hernia without obstruction or gangrene: Secondary | ICD-10-CM | POA: Diagnosis not present

## 2021-08-25 DIAGNOSIS — Z20822 Contact with and (suspected) exposure to covid-19: Secondary | ICD-10-CM | POA: Diagnosis not present

## 2021-08-25 DIAGNOSIS — U071 COVID-19: Secondary | ICD-10-CM | POA: Diagnosis not present

## 2021-09-14 DIAGNOSIS — Z8616 Personal history of COVID-19: Secondary | ICD-10-CM | POA: Diagnosis not present

## 2021-09-14 DIAGNOSIS — G4730 Sleep apnea, unspecified: Secondary | ICD-10-CM | POA: Diagnosis not present

## 2021-09-14 DIAGNOSIS — Z79899 Other long term (current) drug therapy: Secondary | ICD-10-CM | POA: Diagnosis not present

## 2021-09-14 DIAGNOSIS — K429 Umbilical hernia without obstruction or gangrene: Secondary | ICD-10-CM | POA: Diagnosis not present

## 2021-09-14 DIAGNOSIS — E785 Hyperlipidemia, unspecified: Secondary | ICD-10-CM | POA: Diagnosis not present

## 2021-10-08 ENCOUNTER — Encounter: Payer: Self-pay | Admitting: Family Medicine

## 2021-10-08 DIAGNOSIS — L578 Other skin changes due to chronic exposure to nonionizing radiation: Secondary | ICD-10-CM | POA: Diagnosis not present

## 2021-10-08 DIAGNOSIS — Z1283 Encounter for screening for malignant neoplasm of skin: Secondary | ICD-10-CM | POA: Diagnosis not present

## 2021-10-08 DIAGNOSIS — R7301 Impaired fasting glucose: Secondary | ICD-10-CM

## 2021-10-08 DIAGNOSIS — Z0000 Encounter for general adult medical examination without abnormal findings: Secondary | ICD-10-CM

## 2021-10-08 DIAGNOSIS — E7800 Pure hypercholesterolemia, unspecified: Secondary | ICD-10-CM

## 2021-10-08 DIAGNOSIS — Z125 Encounter for screening for malignant neoplasm of prostate: Secondary | ICD-10-CM

## 2021-10-08 DIAGNOSIS — L821 Other seborrheic keratosis: Secondary | ICD-10-CM | POA: Diagnosis not present

## 2021-10-08 DIAGNOSIS — D1801 Hemangioma of skin and subcutaneous tissue: Secondary | ICD-10-CM | POA: Diagnosis not present

## 2021-10-19 DIAGNOSIS — E7800 Pure hypercholesterolemia, unspecified: Secondary | ICD-10-CM | POA: Diagnosis not present

## 2021-10-19 DIAGNOSIS — Z0000 Encounter for general adult medical examination without abnormal findings: Secondary | ICD-10-CM | POA: Diagnosis not present

## 2021-10-19 DIAGNOSIS — R7301 Impaired fasting glucose: Secondary | ICD-10-CM | POA: Diagnosis not present

## 2021-10-19 DIAGNOSIS — Z125 Encounter for screening for malignant neoplasm of prostate: Secondary | ICD-10-CM | POA: Diagnosis not present

## 2021-10-20 LAB — COMPREHENSIVE METABOLIC PANEL
AG Ratio: 1.9 (calc) (ref 1.0–2.5)
ALT: 24 U/L (ref 9–46)
AST: 19 U/L (ref 10–35)
Albumin: 4.2 g/dL (ref 3.6–5.1)
Alkaline phosphatase (APISO): 51 U/L (ref 35–144)
BUN: 16 mg/dL (ref 7–25)
CO2: 27 mmol/L (ref 20–32)
Calcium: 9.2 mg/dL (ref 8.6–10.3)
Chloride: 104 mmol/L (ref 98–110)
Creat: 0.86 mg/dL (ref 0.70–1.35)
Globulin: 2.2 g/dL (calc) (ref 1.9–3.7)
Glucose, Bld: 112 mg/dL — ABNORMAL HIGH (ref 65–99)
Potassium: 4.1 mmol/L (ref 3.5–5.3)
Sodium: 139 mmol/L (ref 135–146)
Total Bilirubin: 0.7 mg/dL (ref 0.2–1.2)
Total Protein: 6.4 g/dL (ref 6.1–8.1)

## 2021-10-20 LAB — CBC WITH DIFFERENTIAL/PLATELET
Absolute Monocytes: 394 cells/uL (ref 200–950)
Basophils Absolute: 29 cells/uL (ref 0–200)
Basophils Relative: 0.6 %
Eosinophils Absolute: 149 cells/uL (ref 15–500)
Eosinophils Relative: 3.1 %
HCT: 46.1 % (ref 38.5–50.0)
Hemoglobin: 15.9 g/dL (ref 13.2–17.1)
Lymphs Abs: 1450 cells/uL (ref 850–3900)
MCH: 31.4 pg (ref 27.0–33.0)
MCHC: 34.5 g/dL (ref 32.0–36.0)
MCV: 90.9 fL (ref 80.0–100.0)
MPV: 10 fL (ref 7.5–12.5)
Monocytes Relative: 8.2 %
Neutro Abs: 2779 cells/uL (ref 1500–7800)
Neutrophils Relative %: 57.9 %
Platelets: 237 10*3/uL (ref 140–400)
RBC: 5.07 10*6/uL (ref 4.20–5.80)
RDW: 12.2 % (ref 11.0–15.0)
Total Lymphocyte: 30.2 %
WBC: 4.8 10*3/uL (ref 3.8–10.8)

## 2021-10-20 LAB — HEMOGLOBIN A1C
Hgb A1c MFr Bld: 5.7 % of total Hgb — ABNORMAL HIGH (ref <5.7)
Mean Plasma Glucose: 117 mg/dL
eAG (mmol/L): 6.5 mmol/L

## 2021-10-20 LAB — LIPID PANEL
Cholesterol: 144 mg/dL (ref <200)
HDL: 59 mg/dL (ref >=40)
LDL Cholesterol (Calc): 65 mg/dL (calc)
Non-HDL Cholesterol (Calc): 85 mg/dL (calc) (ref <130)
Total CHOL/HDL Ratio: 2.4 (calc) (ref <5.0)
Triglycerides: 121 mg/dL (ref <150)

## 2021-10-20 LAB — PSA: PSA: 1.53 ng/mL (ref <=4.00)

## 2021-10-22 ENCOUNTER — Other Ambulatory Visit: Payer: Self-pay

## 2021-10-22 ENCOUNTER — Ambulatory Visit (INDEPENDENT_AMBULATORY_CARE_PROVIDER_SITE_OTHER): Payer: BC Managed Care – PPO | Admitting: Family Medicine

## 2021-10-22 ENCOUNTER — Encounter: Payer: Managed Care, Other (non HMO) | Admitting: Osteopathic Medicine

## 2021-10-22 ENCOUNTER — Encounter: Payer: Self-pay | Admitting: Family Medicine

## 2021-10-22 DIAGNOSIS — Z0000 Encounter for general adult medical examination without abnormal findings: Secondary | ICD-10-CM | POA: Diagnosis not present

## 2021-10-22 NOTE — Progress Notes (Signed)
Daniel Carpenter - 65 y.o. male MRN [id number]  Date of birth: [birth year]  Subjective Chief Complaint  Patient presents with   Annual Exam    HPI Daniel Carpenter is a 65 y.o. male here today for annual exam.  Overall he has been in good health.  Mildly elevated glucose on recent labs.  A1c is stable at 5.7%.  No new concerns today.  Had umbilical hernia surgery last month.  Eager to get back to doing more exercise/activity.    Up to date on colon cancer screening.   Non-smoker and no EtOh use.   Declines vaccines today.   Review of Systems  Constitutional:  Negative for chills, fever, malaise/fatigue and weight loss.  HENT:  Negative for congestion, ear pain and sore throat.   Eyes:  Negative for blurred vision, double vision and pain.  Respiratory:  Negative for cough and shortness of breath.   Cardiovascular:  Negative for chest pain and palpitations.  Gastrointestinal:  Negative for abdominal pain, blood in stool, constipation, heartburn and nausea.  Genitourinary:  Negative for dysuria and urgency.  Musculoskeletal:  Negative for joint pain and myalgias.  Neurological:  Negative for dizziness and headaches.  Endo/Heme/Allergies:  Does not bruise/bleed easily.  Psychiatric/Behavioral:  Negative for depression. The patient is not nervous/anxious and does not have insomnia.    No Known Allergies  Past Medical History:  Diagnosis Date   Actinic keratosis    Diabetes mellitus    boderline   Hyperlipidemia    OSA (obstructive sleep apnea)     Past Surgical History:  Procedure Laterality Date   NASAL SEPTUM SURGERY  1998   ROTATOR CUFF REPAIR  2010   right   VASECTOMY      Social History   Socioeconomic History   Marital status: Married    Spouse name: Not on file   Number of children: 1   Years of education: Not on file   Highest education level: Not on file  Occupational History    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  Tobacco Use   Smoking status: Never    Smokeless tobacco: Never  Vaping Use   Vaping Use: Never used  Substance and Sexual Activity   Alcohol use: No    Alcohol/week: 0.0 standard drinks   Drug use: Never   Sexual activity: Yes    Partners: Female  Other Topics Concern   Not on file  Social History Narrative   Not on file   Social Determinants of Health   Financial Resource Strain: Not on file  Food Insecurity: Not on file  Transportation Needs: Not on file  Physical Activity: Not on file  Stress: Not on file  Social Connections: Not on file    Family History  Problem Relation Age of Onset   Emphysema Father    Heart disease Father    Heart attack Mother    Heart disease Mother    Diabetes Sister     Health Maintenance  Topic Date Due   HIV Screening  Never done   Hepatitis C Screening  Never done   COVID-19 Vaccine (3 - Booster for [employer] series) 12/02/2020   INFLUENZA VACCINE  05/31/2021   Zoster Vaccines- Shingrix (1 of 2) 01/20/2022 (Originally 04/17/1975)   Pneumococcal Vaccine 67-45 Years old (2 - PPSV23 if available, else PCV20) 10/22/2022 (Originally 10/04/2019)   TETANUS/TDAP  09/28/2027   COLONOSCOPY (Pts 45-7yrs Insurance coverage will need to be confirmed)  12/30/2028   HPV VACCINES  Aged Out     -----------------------------------------------------------------------------------------------------------------------------------------------------------------------------------------------------------------  Physical Exam BP 124/68    Pulse 67    Wt 194 lb (88 kg)    SpO2 96% Comment: on RA   BMI 27.06 kg/m   Physical Exam Constitutional:      General: He is not in acute distress. HENT:     Head: Normocephalic and atraumatic.     Right Ear: Tympanic membrane and external ear normal.     Left Ear: Tympanic membrane and external ear normal.  Eyes:     General: No scleral icterus. Neck:     Thyroid: No thyromegaly.  Cardiovascular:     Rate and Rhythm: Normal rate and regular rhythm.      Heart sounds: Normal heart sounds.  Pulmonary:     Effort: Pulmonary effort is normal.     Breath sounds: Normal breath sounds.  Abdominal:     General: Bowel sounds are normal. There is no distension.     Palpations: Abdomen is soft.     Tenderness: There is no abdominal tenderness. There is no guarding.  Musculoskeletal:     Cervical back: Normal range of motion.  Lymphadenopathy:     Cervical: No cervical adenopathy.  Skin:    General: Skin is warm and dry.     Findings: No rash.  Neurological:     Mental Status: He is alert and oriented to person, place, and time.     Cranial Nerves: No cranial nerve deficit.     Motor: No abnormal muscle tone.  Psychiatric:        Mood and Affect: Mood normal.        Behavior: Behavior normal.    ------------------------------------------------------------------------------------------------------------------------------------------------------------------------------------------------------------------- Assessment and Plan  Physical exam, annual Well adult Recent labs reviewed with him.   Immunizations:  Declines flu and shingles vaccines.  Screenings:  UTD Anticipatory guidance/Risk factor reduction:  Recommendations per AVS.    No orders of the defined types were placed in this encounter.   No follow-ups on file.    This visit occurred during the SARS-CoV-2 public health emergency.  Safety protocols were in place, including screening questions prior to the visit, additional usage of staff PPE, and extensive cleaning of exam room while observing appropriate contact time as indicated for disinfecting solutions.

## 2021-10-22 NOTE — Assessment & Plan Note (Signed)
Well adult Recent labs reviewed with him.   Immunizations:  Declines flu and shingles vaccines.  Screenings:  UTD Anticipatory guidance/Risk factor reduction:  Recommendations per AVS.

## 2021-10-22 NOTE — Patient Instructions (Signed)

## 2021-11-11 ENCOUNTER — Encounter: Payer: Self-pay | Admitting: Family Medicine

## 2021-11-11 MED ORDER — SIMVASTATIN 40 MG PO TABS: 40.0000 mg | ORAL_TABLET | Freq: Every day | ORAL | 3 refills | Status: DC

## 2021-11-11 MED ORDER — MELOXICAM 15 MG PO TABS: 15.0000 mg | ORAL_TABLET | Freq: Every day | ORAL | 3 refills | Status: DC | PRN

## 2021-12-22 ENCOUNTER — Encounter: Payer: Self-pay | Admitting: Family Medicine

## 2021-12-22 ENCOUNTER — Telehealth: Payer: Self-pay

## 2021-12-22 NOTE — Telephone Encounter (Signed)
Letter and immunizations record printed and placed in Daniel Carpenter's box

## 2021-12-22 NOTE — Telephone Encounter (Signed)
Please contact pt to pick-up documentation per Dr. Ashley Royalty.   Forms in accordian. Thank you

## 2021-12-23 NOTE — Telephone Encounter (Signed)
Patient notified paperwork is ready for pick up . AM

## 2022-10-13 ENCOUNTER — Encounter: Payer: Self-pay | Admitting: Family Medicine

## 2022-10-13 DIAGNOSIS — Z0000 Encounter for general adult medical examination without abnormal findings: Secondary | ICD-10-CM

## 2022-10-13 DIAGNOSIS — E7800 Pure hypercholesterolemia, unspecified: Secondary | ICD-10-CM

## 2022-10-13 DIAGNOSIS — R7301 Impaired fasting glucose: Secondary | ICD-10-CM

## 2022-10-13 DIAGNOSIS — Z125 Encounter for screening for malignant neoplasm of prostate: Secondary | ICD-10-CM

## 2022-10-14 NOTE — Telephone Encounter (Signed)
Lab orders entered

## 2022-10-25 ENCOUNTER — Encounter: Payer: BC Managed Care – PPO | Admitting: Family Medicine

## 2022-10-27 DIAGNOSIS — E7800 Pure hypercholesterolemia, unspecified: Secondary | ICD-10-CM | POA: Diagnosis not present

## 2022-10-27 DIAGNOSIS — Z0000 Encounter for general adult medical examination without abnormal findings: Secondary | ICD-10-CM | POA: Diagnosis not present

## 2022-10-27 DIAGNOSIS — R7301 Impaired fasting glucose: Secondary | ICD-10-CM | POA: Diagnosis not present

## 2022-10-27 DIAGNOSIS — Z125 Encounter for screening for malignant neoplasm of prostate: Secondary | ICD-10-CM | POA: Diagnosis not present

## 2022-10-28 LAB — COMPLETE METABOLIC PANEL WITH GFR
AG Ratio: 1.6 (calc) (ref 1.0–2.5)
ALT: 21 U/L (ref 9–46)
AST: 16 U/L (ref 10–35)
Albumin: 4.1 g/dL (ref 3.6–5.1)
Alkaline phosphatase (APISO): 59 U/L (ref 35–144)
BUN: 16 mg/dL (ref 7–25)
CO2: 31 mmol/L (ref 20–32)
Calcium: 9.2 mg/dL (ref 8.6–10.3)
Chloride: 104 mmol/L (ref 98–110)
Creat: 0.95 mg/dL (ref 0.70–1.35)
Globulin: 2.6 g/dL (calc) (ref 1.9–3.7)
Glucose, Bld: 105 mg/dL — ABNORMAL HIGH (ref 65–99)
Potassium: 4.3 mmol/L (ref 3.5–5.3)
Sodium: 141 mmol/L (ref 135–146)
Total Bilirubin: 0.5 mg/dL (ref 0.2–1.2)
Total Protein: 6.7 g/dL (ref 6.1–8.1)
eGFR: 89 mL/min/{1.73_m2} (ref >=60)

## 2022-10-28 LAB — CBC WITH DIFFERENTIAL/PLATELET
Absolute Monocytes: 311 cells/uL (ref 200–950)
Basophils Absolute: 41 cells/uL (ref 0–200)
Basophils Relative: 0.9 %
Eosinophils Absolute: 158 cells/uL (ref 15–500)
Eosinophils Relative: 3.5 %
HCT: 45.5 % (ref 38.5–50.0)
Hemoglobin: 15.9 g/dL (ref 13.2–17.1)
Lymphs Abs: 1584 cells/uL (ref 850–3900)
MCH: 32.1 pg (ref 27.0–33.0)
MCHC: 34.9 g/dL (ref 32.0–36.0)
MCV: 91.7 fL (ref 80.0–100.0)
MPV: 9.8 fL (ref 7.5–12.5)
Monocytes Relative: 6.9 %
Neutro Abs: 2408 cells/uL (ref 1500–7800)
Neutrophils Relative %: 53.5 %
Platelets: 261 10*3/uL (ref 140–400)
RBC: 4.96 10*6/uL (ref 4.20–5.80)
RDW: 12.2 % (ref 11.0–15.0)
Total Lymphocyte: 35.2 %
WBC: 4.5 10*3/uL (ref 3.8–10.8)

## 2022-10-28 LAB — PSA: PSA: 1.91 ng/mL (ref <=4.00)

## 2022-10-28 LAB — LIPID PANEL W/REFLEX DIRECT LDL
Cholesterol: 160 mg/dL (ref <200)
HDL: 62 mg/dL (ref >=40)
LDL Cholesterol (Calc): 78 mg/dL (calc)
Non-HDL Cholesterol (Calc): 98 mg/dL (calc) (ref <130)
Total CHOL/HDL Ratio: 2.6 (calc) (ref <5.0)
Triglycerides: 112 mg/dL (ref <150)

## 2022-10-28 LAB — HEMOGLOBIN A1C
Hgb A1c MFr Bld: 5.9 % of total Hgb — ABNORMAL HIGH (ref <5.7)
Mean Plasma Glucose: 123 mg/dL
eAG (mmol/L): 6.8 mmol/L

## 2022-11-01 ENCOUNTER — Encounter: Payer: Self-pay | Admitting: Family Medicine

## 2022-11-01 ENCOUNTER — Ambulatory Visit (INDEPENDENT_AMBULATORY_CARE_PROVIDER_SITE_OTHER): Payer: BC Managed Care – PPO | Admitting: Family Medicine

## 2022-11-01 VITALS — BP 126/77 | HR 56 | Ht 71.0 in | Wt 194.0 lb

## 2022-11-01 DIAGNOSIS — M170 Bilateral primary osteoarthritis of knee: Secondary | ICD-10-CM | POA: Diagnosis not present

## 2022-11-01 DIAGNOSIS — Z23 Encounter for immunization: Secondary | ICD-10-CM | POA: Diagnosis not present

## 2022-11-01 DIAGNOSIS — R972 Elevated prostate specific antigen [PSA]: Secondary | ICD-10-CM

## 2022-11-01 DIAGNOSIS — Z0000 Encounter for general adult medical examination without abnormal findings: Secondary | ICD-10-CM | POA: Diagnosis not present

## 2022-11-01 NOTE — Patient Instructions (Signed)
Preventive Care 65 Years and Older, Male Preventive care refers to lifestyle choices and visits with your health care provider that can promote health and wellness. Preventive care visits are also called wellness exams. What can I expect for my preventive care visit? Counseling During your preventive care visit, your health care provider may ask about your: Medical history, including: Past medical problems. Family medical history. History of falls. Current health, including: Emotional well-being. Home life and relationship well-being. Sexual activity. Memory and ability to understand (cognition). Lifestyle, including: Alcohol, nicotine or tobacco, and drug use. Access to firearms. Diet, exercise, and sleep habits. Work and work environment. Sunscreen use. Safety issues such as seatbelt and bike helmet use. Physical exam Your health care provider will check your: Height and weight. These may be used to calculate your BMI (body mass index). BMI is a measurement that tells if you are at a healthy weight. Waist circumference. This measures the distance around your waistline. This measurement also tells if you are at a healthy weight and may help predict your risk of certain diseases, such as type 2 diabetes and high blood pressure. Heart rate and blood pressure. Body temperature. Skin for abnormal spots. What immunizations do I need?  Vaccines are usually given at various ages, according to a schedule. Your health care provider will recommend vaccines for you based on your age, medical history, and lifestyle or other factors, such as travel or where you work. What tests do I need? Screening Your health care provider may recommend screening tests for certain conditions. This may include: Lipid and cholesterol levels. Diabetes screening. This is done by checking your blood sugar (glucose) after you have not eaten for a while (fasting). Hepatitis C test. Hepatitis B test. HIV (human  immunodeficiency virus) test. STI (sexually transmitted infection) testing, if you are at risk. Lung cancer screening. Colorectal cancer screening. Prostate cancer screening. Abdominal aortic aneurysm (AAA) screening. You may need this if you are a current or former smoker. Talk with your health care provider about your test results, treatment options, and if necessary, the need for more tests. Follow these instructions at home: Eating and drinking  Eat a diet that includes fresh fruits and vegetables, whole grains, lean protein, and low-fat dairy products. Limit your intake of foods with high amounts of sugar, saturated fats, and salt. Take vitamin and mineral supplements as recommended by your health care provider. Do not drink alcohol if your health care provider tells you not to drink. If you drink alcohol: Limit how much you have to 0-2 drinks a day. Know how much alcohol is in your drink. In the [place] one drink equals one 12 oz bottle of beer (355 mL), one 5 oz glass of wine (148 mL), or one 1 oz glass of hard liquor (44 mL). Lifestyle Brush your teeth every morning and night with fluoride toothpaste. Floss one time each day. Exercise for at least 30 minutes 5 or more days each week. Do not use any products that contain nicotine or tobacco. These products include cigarettes, chewing tobacco, and vaping devices, such as e-cigarettes. If you need help quitting, ask your health care provider. Do not use drugs. If you are sexually active, practice safe sex. Use a condom or other form of protection to prevent STIs. Take aspirin only as told by your health care provider. Make sure that you understand how much to take and what form to take. Work with your health care provider to find out whether it is safe   and beneficial for you to take aspirin daily. Ask your health care provider if you need to take a cholesterol-lowering medicine (statin). Find healthy ways to manage stress, such  as: Meditation, yoga, or listening to music. Journaling. Talking to a trusted person. Spending time with friends and family. Safety Always wear your seat belt while driving or riding in a vehicle. Do not drive: If you have been drinking alcohol. Do not ride with someone who has been drinking. When you are tired or distracted. While texting. If you have been using any mind-altering substances or drugs. Wear a helmet and other protective equipment during sports activities. If you have firearms in your house, make sure you follow all gun safety procedures. Minimize exposure to UV radiation to reduce your risk of skin cancer. What's next? Visit your health care provider once a year for an annual wellness visit. Ask your health care provider how often you should have your eyes and teeth checked. Stay up to date on all vaccines. This information is not intended to replace advice given to you by your health care provider. Make sure you discuss any questions you have with your health care provider. Document Revised: 04/14/2021 Document Reviewed: 04/14/2021 Elsevier Patient Education  2023 Elsevier Inc.  

## 2022-11-01 NOTE — Assessment & Plan Note (Signed)
Well adult Orders Placed This Encounter  Procedures   PSA  Screenings: PSA velocity increased, repeat in 6 months.   No symptoms at this time.  Immunizations: Flu vaccine given today.  All other immunizations deferred Anticipatory guidance/Risk factor reduction:  recommendations per AVS.

## 2022-11-01 NOTE — Progress Notes (Signed)
Daniel Carpenter - 67 y.o. male MRN [id number]  Date of birth: 1956-04-28  Subjective Chief Complaint  Patient presents with   Annual Exam    HPI Daniel Carpenter is a 67 y.o. male here today for annual exam.  He reports that he is doing pretty well at this time.   He has stopped taking meloxicam daily due to concerns about long term use.  He is getting some relief with tylenol and may see Dr. Dianah Field for knee injection.  Other than knees the base of his thumbs are affected as well.   He continues to stay pretty active.  He feels like diet is good.   He is a non-smoker.  Denies EtOH use at this time.   He would like to have flu vaccine.  Would like to wait on any additional vaccinations at this time.   He does see dermatology annually.  He has regular dental care.   Review of Systems  Constitutional:  Negative for chills, fever, malaise/fatigue and weight loss.  HENT:  Negative for congestion, ear pain and sore throat.   Eyes:  Negative for blurred vision, double vision and pain.  Respiratory:  Negative for cough and shortness of breath.   Cardiovascular:  Negative for chest pain and palpitations.  Gastrointestinal:  Negative for abdominal pain, blood in stool, constipation, heartburn and nausea.  Genitourinary:  Negative for dysuria and urgency.  Musculoskeletal:  Negative for joint pain and myalgias.  Neurological:  Negative for dizziness and headaches.  Endo/Heme/Allergies:  Does not bruise/bleed easily.  Psychiatric/Behavioral:  Negative for depression. The patient is not nervous/anxious and does not have insomnia.      No Known Allergies  Past Medical History:  Diagnosis Date   Actinic keratosis    Diabetes mellitus    boderline   Hyperlipidemia    OSA (obstructive sleep apnea)     Past Surgical History:  Procedure Laterality Date   NASAL SEPTUM SURGERY  1998   ROTATOR CUFF REPAIR  2010   right   VASECTOMY      Social History   Socioeconomic History    Marital status: Married    Spouse name: Not on file   Number of children: 1   Years of education: Not on file   Highest education level: Not on file  Occupational History    Employer: [employer]  Tobacco Use   Smoking status: Never   Smokeless tobacco: Never  Vaping Use   Vaping Use: Never used  Substance and Sexual Activity   Alcohol use: No    Alcohol/week: 0.0 standard drinks of alcohol   Drug use: Never   Sexual activity: Yes    Partners: Female  Other Topics Concern   Not on file  Social History Narrative   Not on file   Social Determinants of Health   Financial Resource Strain: Not on file  Food Insecurity: Not on file  Transportation Needs: Not on file  Physical Activity: Not on file  Stress: Not on file  Social Connections: Not on file    Family History  Problem Relation Age of Onset   Emphysema Father    Heart disease Father    Heart attack Mother    Heart disease Mother    Diabetes Sister     Health Maintenance  Topic Date Due   HIV Screening  Never done   Hepatitis C Screening  Never done   COVID-19 Vaccine (3 - 2023-24 season) 11/17/2022 (Originally 07/01/2022)  INFLUENZA VACCINE  01/29/2023 (Originally 05/31/2022)   Zoster Vaccines- Shingrix (1 of 2) 01/31/2023 (Originally 04/17/1976)   Pneumonia Vaccine 71+ Years old (2 - PPSV23 or PCV20) 11/02/2023 (Originally 04/17/2022)   DTaP/Tdap/Td (2 - Td or Tdap) 09/28/2027   COLONOSCOPY (Pts 45-61yrs Insurance coverage will need to be confirmed)  12/30/2028   HPV VACCINES  Aged Out     ----------------------------------------------------------------------------------------------------------------------------------------------------------------------------------------------------------------- Physical Exam BP 126/77 (BP Location: Left Arm, Patient Position: Sitting, Cuff Size: Normal)   Pulse (!) 56   Ht 5\' 11"  (1.803 m)   Wt 194 lb (88 kg)   SpO2 95%   BMI 27.06 kg/m   Physical  Exam Constitutional:      General: He is not in acute distress. HENT:     Head: Normocephalic and atraumatic.     Right Ear: Tympanic membrane and external ear normal.     Left Ear: Tympanic membrane and external ear normal.  Eyes:     General: No scleral icterus. Neck:     Thyroid: No thyromegaly.  Cardiovascular:     Rate and Rhythm: Normal rate and regular rhythm.     Heart sounds: Normal heart sounds.  Pulmonary:     Effort: Pulmonary effort is normal.     Breath sounds: Normal breath sounds.  Abdominal:     General: Bowel sounds are normal. There is no distension.     Palpations: Abdomen is soft.     Tenderness: There is no abdominal tenderness. There is no guarding.  Musculoskeletal:     Cervical back: Normal range of motion.  Lymphadenopathy:     Cervical: No cervical adenopathy.  Skin:    General: Skin is warm and dry.     Findings: No rash.  Neurological:     Mental Status: He is alert and oriented to person, place, and time.     Cranial Nerves: No cranial nerve deficit.     Motor: No abnormal muscle tone.  Psychiatric:        Mood and Affect: Mood normal.        Behavior: Behavior normal.     ------------------------------------------------------------------------------------------------------------------------------------------------------------------------------------------------------------------- Assessment and Plan  Physical exam, annual Well adult Orders Placed This Encounter  Procedures   PSA  Screenings: PSA velocity increased, repeat in 6 months.   No symptoms at this time.  Immunizations: Flu vaccine given today.  All other immunizations deferred Anticipatory guidance/Risk factor reduction:  recommendations per AVS.    Primary osteoarthritis of both knees Discussed pros and cons of long term nsaid use.  Renal function ok.  He does have some reflux symptoms.  Recommend tylenol daily for pain, no more than 3000mg  per day.  He may use meloxicam as  needed along with this.  Recommend trial of voltaren gel at the base of the thumbs and/or on the knees as needed.  He will plan to follow up with Dr. Dianah Field as wel.    No orders of the defined types were placed in this encounter.   No follow-ups on file.    This visit occurred during the SARS-CoV-2 public health emergency.  Safety protocols were in place, including screening questions prior to the visit, additional usage of staff PPE, and extensive cleaning of exam room while observing appropriate contact time as indicated for disinfecting solutions.

## 2022-11-01 NOTE — Addendum Note (Signed)
Addended by: Peggye Ley on: 11/01/2022 09:38 AM   Modules accepted: Orders

## 2022-11-01 NOTE — Assessment & Plan Note (Addendum)
Discussed pros and cons of long term nsaid use.  Renal function ok.  He does have some reflux symptoms.  Recommend tylenol daily for pain, no more than 3000mg  per day.  He may use meloxicam as needed along with this.  Recommend trial of voltaren gel at the base of the thumbs and/or on the knees as needed.  He will plan to follow up with Dr. Dianah Field as wel.

## 2022-11-21 ENCOUNTER — Other Ambulatory Visit: Payer: Self-pay | Admitting: Family Medicine

## 2022-12-16 ENCOUNTER — Ambulatory Visit (INDEPENDENT_AMBULATORY_CARE_PROVIDER_SITE_OTHER): Payer: BC Managed Care – PPO | Admitting: Sports Medicine

## 2022-12-16 DIAGNOSIS — M6740 Ganglion, unspecified site: Secondary | ICD-10-CM | POA: Diagnosis not present

## 2022-12-16 NOTE — Assessment & Plan Note (Signed)
Pleasant 67 year old male, he has noted a fullness right first [place], on exam he has a palpable ganglion cyst. No pain. I explained the anatomy and pathophysiology, as he is not hurting we will get no imaging, no changes in medications although I have encouraged him to drop his meloxicam to half pill daily to see if he got similar relief. Home conditioning given, return to see me as needed.

## 2022-12-16 NOTE — Patient Instructions (Signed)
Mediterranean Diet  Why follow it? Research shows. Those who follow the Mediterranean diet have a reduced risk of heart disease  The diet is associated with a reduced incidence of Parkinson's and Alzheimer's diseases People following the diet may have longer life expectancies and lower rates of chronic diseases  The Dietary Guidelines for Americans recommends the Mediterranean diet as an eating plan to promote health and prevent disease  What Is the Mediterranean Diet?  Healthy eating plan based on typical foods and recipes of Mediterranean-style cooking The diet is primarily a plant based diet; these foods should make up a majority of meals   Starches - Plant based foods should make up a majority of meals - They are an important sources of vitamins, minerals, energy, antioxidants, and fiber - Choose whole grains, foods high in fiber and minimally processed items  - Typical grain sources include wheat, oats, barley, corn, brown rice, bulgar, farro, millet, polenta, couscous  - Various types of beans include chickpeas, lentils, fava beans, black beans, white beans   Fruits  Veggies - Large quantities of antioxidant rich fruits & veggies; 6 or more servings  - Vegetables can be eaten raw or lightly drizzled with oil and cooked  - Vegetables common to the traditional Mediterranean Diet include: artichokes, arugula, beets, broccoli, brussel sprouts, cabbage, carrots, celery, collard greens, cucumbers, eggplant, kale, leeks, lemons, lettuce, mushrooms, okra, onions, peas, peppers, potatoes, pumpkin, radishes, rutabaga, shallots, spinach, sweet potatoes, turnips, zucchini - Fruits common to the Mediterranean Diet include: apples, apricots, avocados, cherries, clementines, dates, figs, grapefruits, grapes, melons, nectarines, oranges, peaches, pears, pomegranates, strawberries, tangerines  Fats - Replace butter and margarine with healthy oils, such as olive oil, canola oil, and tahini  - Limit  nuts to no more than a handful a day  - Nuts include walnuts, almonds, pecans, pistachios, pine nuts  - Limit or avoid candied, honey roasted or heavily salted nuts - Olives are central to the [employer] - can be eaten whole or used in a variety of dishes   Meats Protein - Limiting red meat: no more than a few times a month - When eating red meat: choose lean cuts and keep the portion to the size of deck of cards - Eggs: approx. 0 to 4 times a week  - Fish and lean poultry: at least 2 a week  - Healthy protein sources include, chicken, [place], lean beef, lamb - Increase intake of seafood such as tuna, salmon, trout, mackerel, shrimp, scallops - Avoid or limit high fat processed meats such as sausage and bacon  Dairy - Include moderate amounts of low fat dairy products  - Focus on healthy dairy such as fat free yogurt, skim milk, low or reduced fat cheese - Limit dairy products higher in fat such as whole or 2% milk, cheese, ice cream  Alcohol - Moderate amounts of red wine is ok  - No more than 5 oz daily for women (all ages) and men older than age 10  - No more than 10 oz of wine daily for men younger than 43  Other - Limit sweets and other desserts  - Use herbs and spices instead of salt to flavor foods  - Herbs and spices common to the traditional Mediterranean Diet include: basil, bay leaves, chives, cloves, cumin, fennel, garlic, lavender, marjoram, mint, oregano, parsley, pepper, rosemary, sage, savory, sumac, tarragon, thyme   It's not just a diet, it's a lifestyle:  The Mediterranean diet includes lifestyle factors typical of  those in the region  Foods, drinks and meals are best eaten with others and savored Daily physical activity is important for overall good health This could be strenuous exercise like running and aerobics This could also be more leisurely activities such as walking, housework, yard-work, or taking the stairs Moderation is the key; a balanced and healthy  diet accommodates most foods and drinks Consider portion sizes and frequency of consumption of certain foods   Meal Ideas & Options:  Breakfast:  Whole wheat toast or whole wheat English muffins with peanut butter & hard boiled egg Steel cut oats topped with apples & cinnamon and skim milk  Fresh fruit: banana, strawberries, melon, berries, peaches  Smoothies: strawberries, bananas, greek yogurt, peanut butter Low fat greek yogurt with blueberries and granola  Egg white omelet with spinach and mushrooms Breakfast couscous: whole wheat couscous, apricots, skim milk, cranberries  Sandwiches:  Hummus and grilled vegetables (peppers, zucchini, squash) on whole wheat bread   Grilled chicken on whole wheat pita with lettuce, tomatoes, cucumbers or tzatziki  [place] salad on whole wheat bread: tuna salad made with greek yogurt, olives, red peppers, capers, green onions Garlic rosemary lamb pita: lamb sauted with garlic, rosemary, salt & pepper; add lettuce, cucumber, greek yogurt to pita - flavor with lemon juice and black pepper  Seafood:  Mediterranean grilled salmon, seasoned with garlic, basil, parsley, lemon juice and black pepper Shrimp, lemon, and spinach whole-grain pasta salad made with low fat greek yogurt  Seared scallops with lemon orzo  Seared tuna steaks seasoned salt, pepper, coriander topped with tomato mixture of olives, tomatoes, olive oil, minced garlic, parsley, green onions and cappers  Meats:  Herbed greek chicken salad with kalamata olives, cucumber, feta  Red bell peppers stuffed with spinach, bulgur, lean ground beef (or lentils) & topped with feta   Kebabs: skewers of chicken, tomatoes, onions, zucchini, squash  [place] burgers: made with red onions, mint, dill, lemon juice, feta cheese topped with roasted red peppers Vegetarian Cucumber salad: cucumbers, artichoke hearts, celery, red onion, feta cheese, tossed in olive oil & lemon juice  Hummus and whole grain pita  points with a greek salad (lettuce, tomato, feta, olives, cucumbers, red onion) Lentil soup with celery, carrots made with vegetable broth, garlic, salt and pepper  Tabouli salad: parsley, bulgur, mint, scallions, cucumbers, tomato, radishes, lemon juice, olive oil, salt and pepper.

## 2022-12-16 NOTE — Progress Notes (Signed)
    Procedures performed today:    None.  Independent interpretation of notes and tests performed by another provider:   None.  Brief History, Exam, Impression, and Recommendations:    Ganglion cyst right first [facility] Pleasant 67 year old male, he has noted a fullness right first [place], on exam he has a palpable ganglion cyst. No pain. I explained the anatomy and pathophysiology, as he is not hurting we will get no imaging, no changes in medications although I have encouraged him to drop his meloxicam to half pill daily to see if he got similar relief. Home conditioning given, return to see me as needed.    ____________________________________________ Gwen Her. Dianah Field, M.D., ABFM., CAQSM., AME. Primary Care and Sports Medicine [facility]  Adjunct Professor of [employer] [facility] of Medicine  Risk manager

## 2023-01-26 ENCOUNTER — Other Ambulatory Visit: Payer: Self-pay | Admitting: Sports Medicine

## 2023-01-31 ENCOUNTER — Other Ambulatory Visit (INDEPENDENT_AMBULATORY_CARE_PROVIDER_SITE_OTHER): Payer: BC Managed Care – PPO

## 2023-01-31 ENCOUNTER — Ambulatory Visit (INDEPENDENT_AMBULATORY_CARE_PROVIDER_SITE_OTHER): Payer: BC Managed Care – PPO | Admitting: Sports Medicine

## 2023-01-31 DIAGNOSIS — M6740 Ganglion, unspecified site: Secondary | ICD-10-CM

## 2023-01-31 NOTE — Assessment & Plan Note (Signed)
Liliane Channel returns, he has first [facility] osteoarthritis with a palpable ganglion cyst, at the last visit was not hurting so we treated him conservatively, meloxicam and home conditioning. Unfortunately the pain is worsened so today we did a first [place] injection on the right, return to see me in 6 weeks, continue home physical therapy.

## 2023-01-31 NOTE — Progress Notes (Signed)
    Procedures performed today:    Procedure: Real-time Ultrasound Guided injection of the right first MCP Device: Samsung HS60  Verbal informed consent obtained.  Time-out conducted.  Noted no overlying erythema, induration, or other signs of local infection.  Skin prepped in a sterile fashion.  Local anesthesia: Topical Ethyl chloride.  With sterile technique and under real time ultrasound guidance: Noted arthritic joint, 1/2 cc kenalog 40, 1/2 cc lidocaine injected easily.  Completed without difficulty  Advised to call if fevers/chills, erythema, induration, drainage, or persistent bleeding.  Images permanently stored and available for review in PACS.  Impression: Technically successful ultrasound guided injection.  Independent interpretation of notes and tests performed by another provider:   None.  Brief History, Exam, Impression, and Recommendations:    Ganglion cyst right first [place] returns, he has first [facility] osteoarthritis with a palpable ganglion cyst, at the last visit was not hurting so we treated him conservatively, meloxicam and home conditioning. Unfortunately the pain is worsened so today we did a first [place] injection on the right, return to see me in 6 weeks, continue home physical therapy.    ____________________________________________ Gwen Her. Dianah Field, M.D., ABFM., CAQSM., AME. Primary Care and Sports Medicine [facility]  Adjunct Professor of [employer] [facility] of Medicine  Risk manager

## 2023-03-13 ENCOUNTER — Ambulatory Visit: Payer: BC Managed Care – PPO | Admitting: Sports Medicine

## 2023-09-05 DIAGNOSIS — K080 Exfoliation of teeth due to systemic causes: Secondary | ICD-10-CM | POA: Diagnosis not present

## 2023-09-22 ENCOUNTER — Telehealth: Payer: Self-pay | Admitting: Family Medicine

## 2023-09-22 DIAGNOSIS — R7303 Prediabetes: Secondary | ICD-10-CM

## 2023-09-22 DIAGNOSIS — Z125 Encounter for screening for malignant neoplasm of prostate: Secondary | ICD-10-CM

## 2023-09-22 DIAGNOSIS — Z0000 Encounter for general adult medical examination without abnormal findings: Secondary | ICD-10-CM

## 2023-09-22 DIAGNOSIS — E7800 Pure hypercholesterolemia, unspecified: Secondary | ICD-10-CM

## 2023-09-22 NOTE — Telephone Encounter (Signed)
Patient is requesting lab orders be ordered prior to his appointment for 11-07-23

## 2023-10-02 NOTE — Addendum Note (Signed)
Addended by: Mammie Lorenzo on: 10/02/2023 12:57 PM   Modules accepted: Orders

## 2023-10-17 DIAGNOSIS — K080 Exfoliation of teeth due to systemic causes: Secondary | ICD-10-CM | POA: Diagnosis not present

## 2023-10-18 ENCOUNTER — Ambulatory Visit
Admission: EM | Admit: 2023-10-18 | Discharge: 2023-10-18 | Disposition: A | Discharge status: Home / Self Care | Payer: Medicare Other

## 2023-10-18 ENCOUNTER — Other Ambulatory Visit: Payer: Self-pay

## 2023-10-18 DIAGNOSIS — T189XXA Foreign body of alimentary tract, part unspecified, initial encounter: Secondary | ICD-10-CM | POA: Diagnosis not present

## 2023-10-18 NOTE — ED Provider Notes (Signed)
Daniel Carpenter CARE    CSN: [id number] Arrival date & time: 10/18/23  1808      History   Chief Complaint No chief complaint on file.   HPI Daniel Carpenter is a 67 y.o. male.   HPI 67 year old male presents with swallowed beer tablet earlier today.  PMH significant for T2DM, HLD and OSA.  Past Medical History:  Diagnosis Date   Actinic keratosis    Diabetes mellitus    boderline   Hyperlipidemia    OSA (obstructive sleep apnea)     Patient Active Problem List   Diagnosis Date Noted   Ganglion cyst right first [facility] 12/16/2022   Primary osteoarthritis of left hip 03/23/2020   DDD (degenerative disc disease), cervical 12/18/2019   Lumbar degenerative disc disease 12/18/2019   Umbilical hernia without obstruction and without gangrene 10/03/2018   Hypercholesteremia 09/08/2016   Primary osteoarthritis of both knees 09/08/2016   S/P right knee arthroscopy 08/06/2015   Blood in semen 04/22/2014   Physical exam, annual 08/29/2012   Obstructive sleep apnea 05/16/2008   ACTINIC KERATOSIS 05/16/2008   Impaired fasting glucose 05/15/2008    Past Surgical History:  Procedure Laterality Date   NASAL SEPTUM SURGERY  1998   ROTATOR CUFF REPAIR  2010   right   VASECTOMY         Home Medications    Prior to Admission medications   Medication Sig Start Date End Date Taking? Authorizing Provider  cholecalciferol (VITAMIN D) 1000 UNITS tablet Take 1 tablet (1,000 Units total) by mouth daily. 09/08/14   Michele Mcalpine, MD  fish oil-omega-3 fatty acids 1000 MG capsule Take 2 capsules (2 g total) by mouth daily. 08/29/12   Michele Mcalpine, MD  Glucosamine-Chondroit-Vit C-Mn (GLUCOSAMINE 1500 COMPLEX) CAPS Take 2 capsules by mouth daily. 09/08/14   Michele Mcalpine, MD  meloxicam (MOBIC) 15 MG tablet TAKE ONE TABLET BY MOUTH DAILY AS NEEDED FOR PAIN 11/22/22   Everrett Coombe, DO  simvastatin (ZOCOR) 40 MG tablet TAKE ONE TABLET (40MG ) BY MOUTH AT BEDTIME 01/26/23   Everrett Coombe, DO  vitamin B-12 (CYANOCOBALAMIN) 500 MCG tablet Take 1 tablet (500 mcg total) by mouth daily. 09/08/14   Michele Mcalpine, MD  vitamin C (ASCORBIC ACID) 500 MG tablet Take 2 tablets (1,000 mg total) by mouth daily. 09/08/14   Michele Mcalpine, MD    Family History Family History  Problem Relation Age of Onset   Emphysema Father    Heart disease Father    Heart attack Mother    Heart disease Mother    Diabetes Sister     Social History Social History   Tobacco Use   Smoking status: Never   Smokeless tobacco: Never  Vaping Use   Vaping status: Never Used  Substance Use Topics   Alcohol use: No    Alcohol/week: 0.0 standard drinks of alcohol   Drug use: Never     Allergies   Patient has no known allergies.   Review of Systems Review of Systems   Physical Exam Triage Vital Signs ED Triage Vitals  Encounter Vitals Group     BP 10/18/23 1857 137/88     Systolic BP Percentile --      Diastolic BP Percentile --      Pulse Rate 10/18/23 1857 62     Resp 10/18/23 1857 16     Temp 10/18/23 1857 (!) 97.5 F (36.4 C)     Temp src --  SpO2 10/18/23 1857 99 %     Weight --      Height --      Head Circumference --      Peak Flow --      Pain Score 10/18/23 1859 0     Pain Loc --      Pain Education --      Exclude from Growth Chart --    No data found.  Updated Vital Signs BP 137/88   Pulse 62   Temp (!) 97.5 F (36.4 C)   Resp 16   SpO2 99%    Physical Exam Vitals and nursing note reviewed.  Constitutional:      Appearance: Normal appearance. He is normal weight.  HENT:     Head: Normocephalic and atraumatic.     Mouth/Throat:     Mouth: Mucous membranes are moist.     Pharynx: Oropharynx is clear.  Eyes:     Extraocular Movements: Extraocular movements intact.     Conjunctiva/sclera: Conjunctivae normal.     Pupils: Pupils are equal, round, and reactive to light.  Cardiovascular:     Rate and Rhythm: Normal rate and regular rhythm.      Pulses: Normal pulses.     Heart sounds: Normal heart sounds.  Pulmonary:     Effort: Pulmonary effort is normal.     Breath sounds: No wheezing, rhonchi or rales.  Musculoskeletal:        General: Normal range of motion.     Cervical back: Normal range of motion and neck supple.  Skin:    General: Skin is warm and dry.  Neurological:     General: No focal deficit present.     Mental Status: He is alert and oriented to person, place, and time. Mental status is at baseline.  Psychiatric:        Mood and Affect: Mood normal.        Behavior: Behavior normal.        Thought Content: Thought content normal.      UC Treatments / Results  Labs (all labs ordered are listed, but only abnormal results are displayed) Labs Reviewed - No data to display  EKG   Radiology No results found.  Procedures Procedures (including critical care time)  Medications Ordered in UC Medications - No data to display  Initial Impression / Assessment and Plan / UC Course  I have reviewed the triage vital signs and the nursing notes.  Pertinent labs & imaging results that were available during my care of the patient were reviewed by me and considered in my medical decision making (see chart for details).     MDM: 1.  Swallowed foreign body on initial encounter-Advised patient small aluminum beer tab would most likely be passed in bowel movement in the next 1 to 3 days.  Encouraged increase daily water intake to 64 ounces per day 7 days/week.  Advised if symptoms worsen and/or unresolved please follow-up with PCP or here for further evaluation.  Final Clinical Impressions(s) / UC Diagnoses   Final diagnoses:  Swallowed foreign body, initial encounter     Discharge Instructions      Advised patient small aluminum beer tab would most likely be passed in bowel movement in the next 1 to 3 days.  Encouraged increase daily water intake to 64 ounces per day 7 days/week.  Advised if symptoms worsen  and/or unresolved please follow-up with PCP or here for further evaluation.     ED  Prescriptions   None    PDMP not reviewed this encounter.   Trevor Iha, FNP 10/18/23 [phone]

## 2023-10-18 NOTE — Discharge Instructions (Addendum)
Advised patient small aluminum beer tab would most likely be passed in bowel movement in the next 1 to 3 days.  Encouraged increase daily water intake to 64 ounces per day 7 days/week.  Advised if symptoms worsen and/or unresolved please follow-up with PCP or here for further evaluation.

## 2023-10-18 NOTE — ED Triage Notes (Signed)
Swallowed a beer tab today

## 2023-10-23 DIAGNOSIS — R69 Illness, unspecified: Secondary | ICD-10-CM | POA: Diagnosis not present

## 2023-11-03 ENCOUNTER — Encounter: Payer: BC Managed Care – PPO | Admitting: Family Medicine

## 2023-11-03 DIAGNOSIS — Z125 Encounter for screening for malignant neoplasm of prostate: Secondary | ICD-10-CM | POA: Diagnosis not present

## 2023-11-03 DIAGNOSIS — E7800 Pure hypercholesterolemia, unspecified: Secondary | ICD-10-CM | POA: Diagnosis not present

## 2023-11-03 DIAGNOSIS — Z0000 Encounter for general adult medical examination without abnormal findings: Secondary | ICD-10-CM | POA: Diagnosis not present

## 2023-11-03 DIAGNOSIS — R7303 Prediabetes: Secondary | ICD-10-CM | POA: Diagnosis not present

## 2023-11-04 LAB — CMP14+EGFR
ALT: 24 [IU]/L (ref 0–44)
AST: 17 [IU]/L (ref 0–40)
Albumin: 4.2 g/dL (ref 3.9–4.9)
Alkaline Phosphatase: 68 [IU]/L (ref 44–121)
BUN/Creatinine Ratio: 17 (ref 10–24)
BUN: 17 mg/dL (ref 8–27)
Bilirubin Total: 0.7 mg/dL (ref 0.0–1.2)
CO2: 28 mmol/L (ref 20–29)
Calcium: 9.7 mg/dL (ref 8.6–10.2)
Chloride: 102 mmol/L (ref 96–106)
Creatinine, Ser: 0.99 mg/dL (ref 0.76–1.27)
Globulin, Total: 2.3 g/dL (ref 1.5–4.5)
Glucose: 108 mg/dL — ABNORMAL HIGH (ref 70–99)
Potassium: 4.7 mmol/L (ref 3.5–5.2)
Sodium: 142 mmol/L (ref 134–144)
Total Protein: 6.5 g/dL (ref 6.0–8.5)
eGFR: 84 mL/min/{1.73_m2} (ref >59)

## 2023-11-04 LAB — CBC WITH DIFFERENTIAL/PLATELET
Basophils Absolute: 0 10*3/uL (ref 0.0–0.2)
Basos: 1 %
EOS (ABSOLUTE): 0.2 10*3/uL (ref 0.0–0.4)
Eos: 3 %
Hematocrit: 48.4 % (ref 37.5–51.0)
Hemoglobin: 16.2 g/dL (ref 13.0–17.7)
Immature Grans (Abs): 0 10*3/uL (ref 0.0–0.1)
Immature Granulocytes: 0 %
Lymphocytes Absolute: 1.5 10*3/uL (ref 0.7–3.1)
Lymphs: 28 %
MCH: 30.6 pg (ref 26.6–33.0)
MCHC: 33.5 g/dL (ref 31.5–35.7)
MCV: 92 fL (ref 79–97)
Monocytes Absolute: 0.5 10*3/uL (ref 0.1–0.9)
Monocytes: 9 %
Neutrophils Absolute: 3.1 10*3/uL (ref 1.4–7.0)
Neutrophils: 59 %
Platelets: 287 10*3/uL (ref 150–450)
RBC: 5.29 x10E6/uL (ref 4.14–5.80)
RDW: 12.4 % (ref 11.6–15.4)
WBC: 5.3 10*3/uL (ref 3.4–10.8)

## 2023-11-04 LAB — LIPID PANEL WITH LDL/HDL RATIO
Cholesterol, Total: 209 mg/dL — ABNORMAL HIGH (ref 100–199)
HDL: 57 mg/dL (ref >39)
LDL Chol Calc (NIH): 124 mg/dL — ABNORMAL HIGH (ref 0–99)
LDL/HDL Ratio: 2.2 {ratio} (ref 0.0–3.6)
Triglycerides: 159 mg/dL — ABNORMAL HIGH (ref 0–149)
VLDL Cholesterol Cal: 28 mg/dL (ref 5–40)

## 2023-11-04 LAB — PSA: Prostate Specific Ag, Serum: 2.1 ng/mL (ref 0.0–4.0)

## 2023-11-04 LAB — HEMOGLOBIN A1C
Est. average glucose Bld gHb Est-mCnc: 123 mg/dL
Hgb A1c MFr Bld: 5.9 % — ABNORMAL HIGH (ref 4.8–5.6)

## 2023-11-08 ENCOUNTER — Encounter: Payer: Self-pay | Admitting: Family Medicine

## 2023-11-08 ENCOUNTER — Ambulatory Visit (INDEPENDENT_AMBULATORY_CARE_PROVIDER_SITE_OTHER): Payer: Medicare Other | Admitting: Family Medicine

## 2023-11-08 VITALS — BP 114/70 | HR 63 | Ht 71.0 in | Wt 195.0 lb

## 2023-11-08 DIAGNOSIS — K219 Gastro-esophageal reflux disease without esophagitis: Secondary | ICD-10-CM | POA: Insufficient documentation

## 2023-11-08 DIAGNOSIS — G4733 Obstructive sleep apnea (adult) (pediatric): Secondary | ICD-10-CM | POA: Diagnosis not present

## 2023-11-08 DIAGNOSIS — Z0000 Encounter for general adult medical examination without abnormal findings: Secondary | ICD-10-CM

## 2023-11-08 MED ORDER — AMBULATORY NON FORMULARY MEDICATION: 0 refills | Status: DC

## 2023-11-08 NOTE — Assessment & Plan Note (Signed)
 Message on machine indicates that motor is reaching end of life.  Rx for updated machine provided.

## 2023-11-08 NOTE — Progress Notes (Signed)
 Daniel Carpenter - 68 y.o. male MRN [id number]  Date of birth: Feb 27, 1956  Subjective Chief Complaint  Patient presents with   Annual Exam    HPI Daniel Carpenter is a 68 y.o. male here today for annual exam.   He reports that he is doing well.  Having some intermittent heartburn.   He had labs completed prior to visit.  A1c remain in the prediabetes range but is stable.  Prescribed simvastatin , LDL remains elevated at 124.  This is up from 78 last year. He had been off of medication for a few weeks when labs were checked recently.   He is moderately active.  He feels that diet is pretty good.   Non-smoker.  Denies EtOH use.    Review of Systems  Constitutional:  Negative for chills, fever, malaise/fatigue and weight loss.  HENT:  Negative for congestion, ear pain and sore throat.   Eyes:  Negative for blurred vision, double vision and pain.  Respiratory:  Negative for cough and shortness of breath.   Cardiovascular:  Negative for chest pain and palpitations.  Gastrointestinal:  Negative for abdominal pain, blood in stool, constipation, heartburn and nausea.  Genitourinary:  Negative for dysuria and urgency.  Musculoskeletal:  Negative for joint pain and myalgias.  Neurological:  Negative for dizziness and headaches.  Endo/Heme/Allergies:  Does not bruise/bleed easily.  Psychiatric/Behavioral:  Negative for depression. The patient is not nervous/anxious and does not have insomnia.     No Known Allergies  Past Medical History:  Diagnosis Date   Actinic keratosis    Diabetes mellitus    boderline   Hyperlipidemia    OSA (obstructive sleep apnea)     Past Surgical History:  Procedure Laterality Date   NASAL SEPTUM SURGERY  1998   ROTATOR CUFF REPAIR  2010   right   VASECTOMY      Social History   Socioeconomic History   Marital status: Married    Spouse name: Not on file   Number of children: 1   Years of education: Not on file   Highest education level: Not  on file  Occupational History    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  Tobacco Use   Smoking status: Never   Smokeless tobacco: Never  Vaping Use   Vaping status: Never Used  Substance and Sexual Activity   Alcohol use: No    Alcohol/week: 0.0 standard drinks of alcohol   Drug use: Never   Sexual activity: Yes    Partners: Female  Other Topics Concern   Not on file  Social History Narrative   Not on file   Social Drivers of Health   Financial Resource Strain: Unknown (10/04/2021)   Received from [employer], [facility]   Overall Financial Resource Strain (CARDIA)    Difficulty of Paying Living Expenses: Patient declined  Food Insecurity: Unknown (10/04/2021)   Received from [facility], [facility]   Hunger Vital Sign    Worried About Running Out of Food in the Last Year: Patient declined    Ran Out of Food in the Last Year: Patient declined  Transportation Needs: Unknown (10/04/2021)   Received from [facility], [facility]   [facility] - Transportation    Lack of Transportation (Medical): Patient declined    Lack of Transportation (Non-Medical): Patient declined  Physical Activity: Insufficiently Active (10/04/2021)   Received from [facility], [facility]   Exercise Vital Sign    Days of Exercise per Week: 4 days  Minutes of Exercise per Session: 30 min  Stress: Unknown (10/04/2021)   Received from [facility], [facility] - Occupational Stress Questionnaire    Feeling of Stress : Patient declined  Social Connections: Unknown (03/11/2022)   Received from [facility], [facility]   Social Network    Social Network: Not on file    Family History  Problem Relation Age of Onset   Emphysema Father    Heart disease Father    Heart attack Mother    Heart disease Mother    Diabetes Sister     Health Maintenance  Topic Date Due   Medicare Annual Wellness (AWV)  Never done   Hepatitis C Screening  Never done    Zoster Vaccines- Shingrix (1 of 2) Never done   INFLUENZA VACCINE  01/29/2024 (Originally 06/01/2023)   Pneumonia Vaccine 48+ Years old (2 of 2 - PPSV23 or PCV20) 11/07/2024 (Originally 04/17/2022)   COVID-19 Vaccine (3 - 2024-25 season) 11/23/2024 (Originally 07/02/2023)   DTaP/Tdap/Td (2 - Td or Tdap) 09/28/2027   Colonoscopy  12/30/2028   HPV VACCINES  Aged Out     ----------------------------------------------------------------------------------------------------------------------------------------------------------------------------------------------------------------- Physical Exam BP 114/70 (BP Location: Left Arm, Patient Position: Sitting, Cuff Size: Normal)   Pulse 63   Ht 5' 11 (1.803 m)   Wt 195 lb (88.5 kg)   SpO2 96%   BMI 27.20 kg/m   Physical Exam Constitutional:      General: He is not in acute distress. HENT:     Head: Normocephalic and atraumatic.     Right Ear: Tympanic membrane and external ear normal.     Left Ear: Tympanic membrane and external ear normal.  Eyes:     General: No scleral icterus. Neck:     Thyroid : No thyromegaly.  Cardiovascular:     Rate and Rhythm: Normal rate and regular rhythm.     Heart sounds: Normal heart sounds.  Pulmonary:     Effort: Pulmonary effort is normal.     Breath sounds: Normal breath sounds.  Abdominal:     General: Bowel sounds are normal. There is no distension.     Palpations: Abdomen is soft.     Tenderness: There is no abdominal tenderness. There is no guarding.  Musculoskeletal:     Cervical back: Normal range of motion.  Lymphadenopathy:     Cervical: No cervical adenopathy.  Skin:    General: Skin is warm and dry.     Findings: No rash.  Neurological:     Mental Status: He is alert and oriented to person, place, and time.     Cranial Nerves: No cranial nerve deficit.     Motor: No abnormal muscle tone.  Psychiatric:        Mood and Affect: Mood normal.        Behavior: Behavior normal.      ------------------------------------------------------------------------------------------------------------------------------------------------------------------------------------------------------------------- Assessment and Plan  GERD (gastroesophageal reflux disease) Intermittent depending on his diet.  Using baking soda as needed.   Physical exam, annual Well adult Recent labs reviewed with him Screenings: UTD Immunizations: UTD Anticipatory guidance/Risk factor reduction:  recommendations per AVS.    Obstructive sleep apnea Message on machine indicates that motor is reaching end of life.  Rx for updated machine provided.    Meds ordered this encounter  Medications   AMBULATORY NON FORMULARY MEDICATION    Sig: Autopap machine.  Settings: 5-20cmH2O    Dispense:  1 Units    Refill:  0    No follow-ups on file.    This visit occurred during the SARS-CoV-2 public health emergency.  Safety protocols were in place, including screening questions prior to the visit, additional usage of staff PPE, and extensive cleaning of exam room while observing appropriate contact time as indicated for disinfecting solutions.

## 2023-11-08 NOTE — Patient Instructions (Signed)
 Preventive Care 73 Years and Older, Male Preventive care refers to lifestyle choices and visits with your health care provider that can promote health and wellness. Preventive care visits are also called wellness exams. What can I expect for my preventive care visit? Counseling During your preventive care visit, your health care provider may ask about your: Medical history, including: Past medical problems. Family medical history. History of falls. Current health, including: Emotional well-being. Home life and relationship well-being. Sexual activity. Memory and ability to understand (cognition). Lifestyle, including: Alcohol, nicotine or tobacco, and drug use. Access to firearms. Diet, exercise, and sleep habits. Work and work Astronomer. Sunscreen use. Safety issues such as seatbelt and bike helmet use. Physical exam Your health care provider will check your: Height and weight. These may be used to calculate your BMI (body mass index). BMI is a measurement that tells if you are at a healthy weight. Waist circumference. This measures the distance around your waistline. This measurement also tells if you are at a healthy weight and may help predict your risk of certain diseases, such as type 2 diabetes and high blood pressure. Heart rate and blood pressure. Body temperature. Skin for abnormal spots. What immunizations do I need?  Vaccines are usually given at various ages, according to a schedule. Your health care provider will recommend vaccines for you based on your age, medical history, and lifestyle or other factors, such as travel or where you work. What tests do I need? Screening Your health care provider may recommend screening tests for certain conditions. This may include: Lipid and cholesterol levels. Diabetes screening. This is done by checking your blood sugar (glucose) after you have not eaten for a while (fasting). Hepatitis C test. Hepatitis B test. HIV (human  immunodeficiency virus) test. STI (sexually transmitted infection) testing, if you are at risk. Lung cancer screening. Colorectal cancer screening. Prostate cancer screening. Abdominal aortic aneurysm (AAA) screening. You may need this if you are a current or former smoker. Talk with your health care provider about your test results, treatment options, and if necessary, the need for more tests. Follow these instructions at home: Eating and drinking  Eat a diet that includes fresh fruits and vegetables, whole grains, lean protein, and low-fat dairy products. Limit your intake of foods with high amounts of sugar, saturated fats, and salt. Take vitamin and mineral supplements as recommended by your health care provider. Do not drink alcohol if your health care provider tells you not to drink. If you drink alcohol: Limit how much you have to 0-2 drinks a day. Know how much alcohol is in your drink. In the [place] one drink equals one 12 oz bottle of beer (355 mL), one 5 oz glass of wine (148 mL), or one 1 oz glass of hard liquor (44 mL). Lifestyle Brush your teeth every morning and night with fluoride toothpaste. Floss one time each day. Exercise for at least 30 minutes 5 or more days each week. Do not use any products that contain nicotine or tobacco. These products include cigarettes, chewing tobacco, and vaping devices, such as e-cigarettes. If you need help quitting, ask your health care provider. Do not use drugs. If you are sexually active, practice safe sex. Use a condom or other form of protection to prevent STIs. Take aspirin only as told by your health care provider. Make sure that you understand how much to take and what form to take. Work with your health care provider to find out whether it is safe  and beneficial for you to take aspirin daily. Ask your health care provider if you need to take a cholesterol-lowering medicine (statin). Find healthy ways to manage stress, such  as: Meditation, yoga, or listening to music. Journaling. Talking to a trusted person. Spending time with friends and family. Safety Always wear your seat belt while driving or riding in a vehicle. Do not drive: If you have been drinking alcohol. Do not ride with someone who has been drinking. When you are tired or distracted. While texting. If you have been using any mind-altering substances or drugs. Wear a helmet and other protective equipment during sports activities. If you have firearms in your house, make sure you follow all gun safety procedures. Minimize exposure to UV radiation to reduce your risk of skin cancer. What's next? Visit your health care provider once a year for an annual wellness visit. Ask your health care provider how often you should have your eyes and teeth checked. Stay up to date on all vaccines. This information is not intended to replace advice given to you by your health care provider. Make sure you discuss any questions you have with your health care provider. Document Revised: 04/14/2021 Document Reviewed: 04/14/2021 Elsevier Patient Education  2024 [employer].

## 2023-11-08 NOTE — Assessment & Plan Note (Signed)
 Well adult Recent labs reviewed with him Screenings: UTD Immunizations: UTD Anticipatory guidance/Risk factor reduction:  recommendations per AVS.

## 2023-11-08 NOTE — Assessment & Plan Note (Signed)
 Intermittent depending on his diet.  Using baking soda as needed.

## 2024-01-01 ENCOUNTER — Other Ambulatory Visit: Payer: Self-pay | Admitting: Family Medicine

## 2024-03-15 DIAGNOSIS — L814 Other melanin hyperpigmentation: Secondary | ICD-10-CM | POA: Diagnosis not present

## 2024-03-15 DIAGNOSIS — D229 Melanocytic nevi, unspecified: Secondary | ICD-10-CM | POA: Diagnosis not present

## 2024-03-18 DIAGNOSIS — K080 Exfoliation of teeth due to systemic causes: Secondary | ICD-10-CM | POA: Diagnosis not present

## 2024-04-08 ENCOUNTER — Encounter: Payer: Self-pay | Admitting: Family Medicine

## 2024-04-08 MED ORDER — AMBULATORY NON FORMULARY MEDICATION: 0 refills | Status: DC

## 2024-04-08 MED ORDER — AMBULATORY NON FORMULARY MEDICATION: 0 refills | Status: AC

## 2024-04-08 NOTE — Telephone Encounter (Signed)
 Patient called the office at 5 pm stating he contacted the CPAP shop and was informed no order was received. The patient requested to speak to CMA Panya directly, however he was informed she was gone for the day. Patient is requesting a call back with an update. Thanks in advance.

## 2024-04-09 ENCOUNTER — Telehealth: Payer: Self-pay | Admitting: Family Medicine

## 2024-04-09 NOTE — Telephone Encounter (Signed)
 Could you please contact Sharin David to let him know that this was signed yesterday and sent back to them. We can send him a copy.

## 2024-04-09 NOTE — Telephone Encounter (Signed)
 He would like a copy sent to him via mychart

## 2024-04-09 NOTE — Telephone Encounter (Signed)
 Original faxed documentation has been given to [employer] to contact patient concerning fax.

## 2024-04-09 NOTE — Telephone Encounter (Signed)
 I sure will thank you

## 2024-04-09 NOTE — Telephone Encounter (Signed)
 Copied from CRM [id number]. Topic: General - Other >> Apr 08, 2024  4:58 PM Adrianna P wrote: Reason for CRM: Patient called because cpap shop does not have fax that was sent. He would like a copy of his prescription sent to him

## 2024-04-10 NOTE — Telephone Encounter (Signed)
 Daniel Carpenter contacted the patient yesterday and had the document to scan to his MyChart, as well.

## 2024-04-15 DIAGNOSIS — R69 Illness, unspecified: Secondary | ICD-10-CM | POA: Diagnosis not present

## 2024-04-17 ENCOUNTER — Other Ambulatory Visit: Payer: Self-pay | Admitting: Family Medicine

## 2024-05-16 DIAGNOSIS — K080 Exfoliation of teeth due to systemic causes: Secondary | ICD-10-CM | POA: Diagnosis not present

## 2024-05-20 DIAGNOSIS — K080 Exfoliation of teeth due to systemic causes: Secondary | ICD-10-CM | POA: Diagnosis not present

## 2024-05-21 ENCOUNTER — Encounter: Payer: Self-pay | Admitting: Family Medicine

## 2024-05-22 ENCOUNTER — Telehealth: Payer: Self-pay

## 2024-05-22 NOTE — Telephone Encounter (Signed)
 Daniel Carpenter, sent for your review.  Do we have these forms for completion?

## 2024-05-22 NOTE — Telephone Encounter (Signed)
 Copied from CRM #[id number]. Topic: Clinical - Order For Equipment >> May 22, 2024  3:55 PM Alfonso ORN wrote: Reason for CRM: patient calling on the status of issue with [employer] has denied my claim stating Medicare doesn't cover CPAP  a form called Certificate of Medical Necessity need to be filled out.  Please contact patient on status  patient call back #  [phone]

## 2024-05-28 NOTE — Telephone Encounter (Signed)
 Please see patient message dated 05/21/2024

## 2024-06-07 ENCOUNTER — Telehealth: Payer: Self-pay | Admitting: Family Medicine

## 2024-06-07 NOTE — Telephone Encounter (Signed)
 Copied from CRM #[id number]. Topic: General - Call Back - No Documentation >> Jun 07, 2024  4:36 PM Kevelyn M wrote: Reason for CRM: Patient is calling for [facility]. Patient is trying to get his results for sleep study for 2004. Call back # [phone]

## 2024-06-10 DIAGNOSIS — H5203 Hypermetropia, bilateral: Secondary | ICD-10-CM | POA: Diagnosis not present

## 2024-06-12 NOTE — Telephone Encounter (Signed)
 I called the office again today in hopes of getting the sleep study.

## 2024-06-12 NOTE — Telephone Encounter (Signed)
 I called the office again today.

## 2024-06-12 NOTE — Telephone Encounter (Signed)
 I called again this morning and they state they will call back.

## 2024-06-13 ENCOUNTER — Telehealth: Payer: Self-pay

## 2024-06-13 NOTE — Telephone Encounter (Signed)
 Spoke with Daniel Carpenter with Dr. Will office  The sleep study she is requesting from 2004 is not uploaded to Epic  I advised will request his paper chart and then call her back with update  If the sleep study is there we can fax to her  Pt is in need of new CPAP and they are trying to order this without him having to go through another study  Email sent to medical records  Will hold in my basket until received

## 2024-06-13 NOTE — Telephone Encounter (Signed)
 Copied from CRM [id number]. Topic: Medical Record Request - Provider/Facility Request >> Jun 12, 2024 10:02 AM Nathanel DEL wrote: Reason for CRM: Jon w/Dr Will office calling to get sleep study results, it looks as thought they are from 08/2003.  Can you please call her back at [phone] asap.  Pt trying multiple attempts to retreive sleep study results for new cpap.  Lela with Dr Alvia office- she transferred me to Luke, as Jon was seeing patients. I informed Luke that we do not have a record of sleep studies in 2003, 2004, or 2014 and she should call medical records. I provided them with their number. Kim verbalized understanding. NFN

## 2024-06-13 NOTE — Telephone Encounter (Signed)
 Please leave in Daniel Carpenter's basket.

## 2024-06-14 NOTE — Telephone Encounter (Signed)
 Received response from medical records- they have requested that the paper chart be emailed to me. Still waiting for this to come through 06/14/24 11:34 AM

## 2024-06-18 NOTE — Telephone Encounter (Signed)
 Attempted call to custodian of records. Left a voice mail message requesting a return call

## 2024-06-18 NOTE — Telephone Encounter (Signed)
 Attempted call again to custodian of records. Again left a voice mail message requesting a return call.

## 2024-06-19 NOTE — Telephone Encounter (Signed)
 I received email with the 2004 sleep study. Forwarded to [place], who had requested this information on our mutual pt. Will route to her to make her aware. Please let us  know if there is anything else we can do to help.

## 2024-06-19 NOTE — Telephone Encounter (Signed)
 Patient called. Wanted to leave message for Luke at [facility]. Sleep study was found and [place] should be reaching out with results. Thank You

## 2024-06-19 NOTE — Telephone Encounter (Signed)
 Sleep study placed in Dr Alvia basket. Also a copy was sent to scan.

## 2024-06-20 NOTE — Telephone Encounter (Signed)
 Sleep study was received and is in Dr. Alvia box for completion

## 2024-06-26 DIAGNOSIS — M25511 Pain in right shoulder: Secondary | ICD-10-CM | POA: Diagnosis not present

## 2024-06-26 DIAGNOSIS — M25819 Other specified joint disorders, unspecified shoulder: Secondary | ICD-10-CM | POA: Insufficient documentation

## 2024-06-26 DIAGNOSIS — M778 Other enthesopathies, not elsewhere classified: Secondary | ICD-10-CM | POA: Diagnosis not present

## 2024-06-26 DIAGNOSIS — M7551 Bursitis of right shoulder: Secondary | ICD-10-CM | POA: Diagnosis not present

## 2024-07-02 ENCOUNTER — Encounter: Payer: Self-pay | Admitting: Sports Medicine

## 2024-07-02 DIAGNOSIS — S46011A Strain of muscle(s) and tendon(s) of the rotator cuff of right shoulder, initial encounter: Secondary | ICD-10-CM | POA: Diagnosis not present

## 2024-07-10 DIAGNOSIS — M75121 Complete rotator cuff tear or rupture of right shoulder, not specified as traumatic: Secondary | ICD-10-CM | POA: Diagnosis not present

## 2024-08-12 DIAGNOSIS — M2241 Chondromalacia patellae, right knee: Secondary | ICD-10-CM | POA: Diagnosis not present

## 2024-08-12 DIAGNOSIS — M25811 Other specified joint disorders, right shoulder: Secondary | ICD-10-CM | POA: Diagnosis not present

## 2024-08-12 DIAGNOSIS — Z4789 Encounter for other orthopedic aftercare: Secondary | ICD-10-CM | POA: Diagnosis not present

## 2024-08-12 DIAGNOSIS — G4730 Sleep apnea, unspecified: Secondary | ICD-10-CM | POA: Diagnosis not present

## 2024-08-12 DIAGNOSIS — G8918 Other acute postprocedural pain: Secondary | ICD-10-CM | POA: Diagnosis not present

## 2024-08-12 DIAGNOSIS — Z79899 Other long term (current) drug therapy: Secondary | ICD-10-CM | POA: Diagnosis not present

## 2024-08-12 DIAGNOSIS — M75121 Complete rotator cuff tear or rupture of right shoulder, not specified as traumatic: Secondary | ICD-10-CM | POA: Diagnosis not present

## 2024-08-12 DIAGNOSIS — M7521 Bicipital tendinitis, right shoulder: Secondary | ICD-10-CM | POA: Diagnosis not present

## 2024-08-12 DIAGNOSIS — E785 Hyperlipidemia, unspecified: Secondary | ICD-10-CM | POA: Diagnosis not present

## 2024-08-12 DIAGNOSIS — M75101 Unspecified rotator cuff tear or rupture of right shoulder, not specified as traumatic: Secondary | ICD-10-CM | POA: Diagnosis not present

## 2024-08-26 DIAGNOSIS — Z9889 Other specified postprocedural states: Secondary | ICD-10-CM | POA: Diagnosis not present

## 2024-09-23 DIAGNOSIS — R29898 Other symptoms and signs involving the musculoskeletal system: Secondary | ICD-10-CM | POA: Diagnosis not present

## 2024-09-23 DIAGNOSIS — M75121 Complete rotator cuff tear or rupture of right shoulder, not specified as traumatic: Secondary | ICD-10-CM | POA: Diagnosis not present

## 2024-09-23 DIAGNOSIS — M25511 Pain in right shoulder: Secondary | ICD-10-CM | POA: Diagnosis not present

## 2024-09-23 DIAGNOSIS — M25611 Stiffness of right shoulder, not elsewhere classified: Secondary | ICD-10-CM | POA: Diagnosis not present

## 2024-09-24 DIAGNOSIS — K080 Exfoliation of teeth due to systemic causes: Secondary | ICD-10-CM | POA: Diagnosis not present

## 2024-09-25 DIAGNOSIS — Z9889 Other specified postprocedural states: Secondary | ICD-10-CM | POA: Diagnosis not present

## 2024-10-01 DIAGNOSIS — M25511 Pain in right shoulder: Secondary | ICD-10-CM | POA: Diagnosis not present

## 2024-10-01 DIAGNOSIS — R29898 Other symptoms and signs involving the musculoskeletal system: Secondary | ICD-10-CM | POA: Diagnosis not present

## 2024-10-01 DIAGNOSIS — M25611 Stiffness of right shoulder, not elsewhere classified: Secondary | ICD-10-CM | POA: Diagnosis not present

## 2024-10-01 DIAGNOSIS — M75121 Complete rotator cuff tear or rupture of right shoulder, not specified as traumatic: Secondary | ICD-10-CM | POA: Diagnosis not present

## 2024-10-02 DIAGNOSIS — M75121 Complete rotator cuff tear or rupture of right shoulder, not specified as traumatic: Secondary | ICD-10-CM | POA: Diagnosis not present

## 2024-10-02 DIAGNOSIS — M25511 Pain in right shoulder: Secondary | ICD-10-CM | POA: Diagnosis not present

## 2024-10-02 DIAGNOSIS — R29898 Other symptoms and signs involving the musculoskeletal system: Secondary | ICD-10-CM | POA: Diagnosis not present

## 2024-10-02 DIAGNOSIS — M25611 Stiffness of right shoulder, not elsewhere classified: Secondary | ICD-10-CM | POA: Diagnosis not present

## 2024-10-08 DIAGNOSIS — M75121 Complete rotator cuff tear or rupture of right shoulder, not specified as traumatic: Secondary | ICD-10-CM | POA: Diagnosis not present

## 2024-10-08 DIAGNOSIS — R29898 Other symptoms and signs involving the musculoskeletal system: Secondary | ICD-10-CM | POA: Diagnosis not present

## 2024-10-08 DIAGNOSIS — M25511 Pain in right shoulder: Secondary | ICD-10-CM | POA: Diagnosis not present

## 2024-10-08 DIAGNOSIS — M25611 Stiffness of right shoulder, not elsewhere classified: Secondary | ICD-10-CM | POA: Diagnosis not present

## 2024-10-18 DIAGNOSIS — R29898 Other symptoms and signs involving the musculoskeletal system: Secondary | ICD-10-CM | POA: Diagnosis not present

## 2024-10-18 DIAGNOSIS — M25611 Stiffness of right shoulder, not elsewhere classified: Secondary | ICD-10-CM | POA: Diagnosis not present

## 2024-10-18 DIAGNOSIS — M25511 Pain in right shoulder: Secondary | ICD-10-CM | POA: Diagnosis not present

## 2024-10-18 DIAGNOSIS — M75121 Complete rotator cuff tear or rupture of right shoulder, not specified as traumatic: Secondary | ICD-10-CM | POA: Diagnosis not present

## 2024-10-20 ENCOUNTER — Encounter: Payer: Self-pay | Admitting: Family Medicine

## 2024-10-21 ENCOUNTER — Ambulatory Visit (INDEPENDENT_AMBULATORY_CARE_PROVIDER_SITE_OTHER): Admitting: Family Medicine

## 2024-10-21 ENCOUNTER — Telehealth: Payer: Self-pay

## 2024-10-21 ENCOUNTER — Ambulatory Visit: Payer: Self-pay | Admitting: Family Medicine

## 2024-10-21 ENCOUNTER — Ambulatory Visit

## 2024-10-21 VITALS — BP 122/77 | HR 93 | Temp 98.7°F | Ht 71.0 in | Wt 195.0 lb

## 2024-10-21 DIAGNOSIS — M7989 Other specified soft tissue disorders: Secondary | ICD-10-CM

## 2024-10-21 MED ORDER — APIXABAN 5 MG PO TABS: ORAL_TABLET | ORAL | 3 refills | Status: DC

## 2024-10-21 NOTE — Assessment & Plan Note (Signed)
 STAT US  ordered to r/o DVT initially.  Will reach out to patient with results once these return.  Red flags and precautions reviewed.

## 2024-10-21 NOTE — Progress Notes (Signed)
 " Daniel Carpenter - 68 y.o. male MRN [id number]  Date of birth: [birth year]  Subjective Chief Complaint  Patient presents with   Leg Pain    Left -     HPI Daniel Carpenter is a 68 y.o. male here today with complaint of L leg pain and swelling.  He had R rotator cuff surgery in October and was less active for a few weeks.  Initially had some swelling and pain in the ankle about 4 weeks after surgery but this improved.  Has feeling of charlie horse in the L upper, inner thigh.  He had had video visit with [facility] provider who recommended that he come in to discuss having US  to evaluate for blood clot.     ROS:  A comprehensive ROS was completed and negative except as noted per HPI  Allergies[1]  Past Medical History:  Diagnosis Date   Actinic keratosis    Diabetes mellitus    boderline   Hyperlipidemia    OSA (obstructive sleep apnea)     Past Surgical History:  Procedure Laterality Date   NASAL SEPTUM SURGERY  1998   ROTATOR CUFF REPAIR  2010   right   VASECTOMY      Social History   Socioeconomic History   Marital status: Married    Spouse name: Not on file   Number of children: 1   Years of education: Not on file   Highest education level: Bachelor's degree (e.g., BA, AB, BS)  Occupational History    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  Tobacco Use   Smoking status: Never   Smokeless tobacco: Never  Vaping Use   Vaping status: Never Used  Substance and Sexual Activity   Alcohol use: No    Alcohol/week: 0.0 standard drinks of alcohol   Drug use: Never   Sexual activity: Yes    Partners: Female  Other Topics Concern   Not on file  Social History Narrative   Not on file   Social Drivers of Health   Tobacco Use: Low Risk (10/21/2024)   Patient History    Smoking Tobacco Use: Never    Smokeless Tobacco Use: Never    Passive Exposure: Not on file  Financial Resource Strain: Patient Declined (10/21/2024)   Overall Financial Resource Strain (CARDIA)    Difficulty  of Paying Living Expenses: Patient declined  Food Insecurity: Patient Declined (10/21/2024)   Epic    Worried About Programme Researcher, Broadcasting/film/video in the Last Year: Patient declined    Barista in the Last Year: Patient declined  Transportation Needs: No Transportation Needs (10/21/2024)   Epic    Lack of Transportation (Medical): No    Lack of Transportation (Non-Medical): No  Physical Activity: Insufficiently Active (10/21/2024)   Exercise Vital Sign    Days of Exercise per Week: 2 days    Minutes of Exercise per Session: 30 min  Stress: No Stress Concern Present (10/21/2024)   [employer] of Occupational Health - Occupational Stress Questionnaire    Feeling of Stress: Not at all  Social Connections: Unknown (10/21/2024)   Social Connection and Isolation Panel    Frequency of Communication with Friends and Family: Patient declined    Frequency of Social Gatherings with Friends and Family: Patient declined    Attends Religious Services: Patient declined    Database Administrator or Organizations: Patient declined    Attends Banker Meetings: Not on file    Marital Status: Married  Depression (PHQ2-9):  Low Risk (10/21/2024)   Depression (PHQ2-9)    PHQ-2 Score: 1  Alcohol Screen: Low Risk (10/21/2024)   Alcohol Screen    Last Alcohol Screening Score (AUDIT): 1  Housing: Patient Declined (10/21/2024)   Epic    Unable to Pay for Housing in the Last Year: Patient declined    Number of Times Moved in the Last Year: Not on file    Homeless in the Last Year: Patient declined  Utilities: Not on file  Health Literacy: Not on file    Family History  Problem Relation Age of Onset   Emphysema Father    Heart disease Father    Heart attack Mother    Heart disease Mother    Diabetes Sister     Health Maintenance  Topic Date Due   Medicare Annual Wellness (AWV)  Never done   Hepatitis C Screening  Never done   Zoster Vaccines- Shingrix (1 of 2) Never done    Influenza Vaccine  05/31/2024   COVID-19 Vaccine (3 - 2025-26 season) 07/01/2024   Pneumococcal Vaccine: 50+ Years (2 of 2 - PCV20 or PCV21) 11/07/2024 (Originally 10/04/2019)   DTaP/Tdap/Td (2 - Td or Tdap) 09/28/2027   Colonoscopy  12/30/2028   Meningococcal B Vaccine  Aged Out     ----------------------------------------------------------------------------------------------------------------------------------------------------------------------------------------------------------------- Physical Exam BP 122/77 (BP Location: Left Arm, Patient Position: Sitting, Cuff Size: Normal)   Pulse 93   Temp 98.7 F (37.1 C) (Oral)   Ht 5' 11 (1.803 m)   Wt 195 lb (88.5 kg)   SpO2 96%   BMI 27.20 kg/m   Physical Exam Constitutional:      Appearance: Normal appearance.  Cardiovascular:     Rate and Rhythm: Normal rate and regular rhythm.  Pulmonary:     Effort: Pulmonary effort is normal.     Breath sounds: Normal breath sounds.  Musculoskeletal:     Comments: Mild swelling of the L upper thigh with TTP along the inner thigh.    Neurological:     General: No focal deficit present.     Mental Status: He is alert.  Psychiatric:        Mood and Affect: Mood normal.        Behavior: Behavior normal.     ------------------------------------------------------------------------------------------------------------------------------------------------------------------------------------------------------------------- Assessment and Plan  Leg swelling STAT US  ordered to r/o DVT initially.  Will reach out to patient with results once these return.  Red flags and precautions reviewed.    No orders of the defined types were placed in this encounter.   No follow-ups on file.        [1] No Known Allergies  "

## 2024-10-22 ENCOUNTER — Ambulatory Visit: Admitting: Family Medicine

## 2024-10-22 MED ORDER — APIXABAN 5 MG PO TABS: ORAL_TABLET | ORAL | 3 refills | Status: AC

## 2024-10-25 ENCOUNTER — Telehealth: Payer: Self-pay

## 2024-10-25 NOTE — Telephone Encounter (Signed)
 Tobias advised patient it was ready for pick up. It is in the back cabinet at Dr Alvia station.

## 2024-10-25 NOTE — Telephone Encounter (Signed)
 Addressed in a different encounter

## 2024-10-25 NOTE — Telephone Encounter (Signed)
 Copied from CRM [id number]. Topic: Clinical - Medication Question >> Oct 23, 2024 10:00 AM Montie POUR wrote: Reason for CRM:  Dick would like for his wife, Joen, to pick up samples of apixaban  (ELIQUIS ) 5 MG TABS tablet and also a hard copy of prescription to take somewhere that it would be cheaper. Please call Dick at [phone] to discuss. Thanks

## 2024-10-29 NOTE — Telephone Encounter (Signed)
 Error no encounter needed.

## 2024-11-01 ENCOUNTER — Telehealth: Payer: Self-pay

## 2024-11-01 NOTE — Telephone Encounter (Signed)
 Left message for a return call regarding sample medication to be picked up.

## 2024-11-01 NOTE — Telephone Encounter (Signed)
 Patient returned call and stated he would come by the office to pick up samples.

## 2024-11-07 ENCOUNTER — Telehealth: Payer: Self-pay | Admitting: Family Medicine

## 2024-11-07 DIAGNOSIS — R7303 Prediabetes: Secondary | ICD-10-CM

## 2024-11-07 DIAGNOSIS — Z125 Encounter for screening for malignant neoplasm of prostate: Secondary | ICD-10-CM

## 2024-11-07 DIAGNOSIS — Z0000 Encounter for general adult medical examination without abnormal findings: Secondary | ICD-10-CM

## 2024-11-07 DIAGNOSIS — E7800 Pure hypercholesterolemia, unspecified: Secondary | ICD-10-CM

## 2024-11-07 NOTE — Telephone Encounter (Signed)
 Pls contact pt and advise  fasting for labs. Orders placed. Thanks

## 2024-11-07 NOTE — Telephone Encounter (Signed)
 Pt would like to come in to have labs done prior to appt on 11/12/24 for physical. Please advise on orders.

## 2024-11-12 ENCOUNTER — Ambulatory Visit: Admitting: Family Medicine

## 2024-11-12 ENCOUNTER — Encounter: Payer: Self-pay | Admitting: Family Medicine

## 2024-11-12 ENCOUNTER — Ambulatory Visit (INDEPENDENT_AMBULATORY_CARE_PROVIDER_SITE_OTHER)

## 2024-11-12 VITALS — BP 125/70 | HR 66 | Ht 71.0 in | Wt 196.0 lb

## 2024-11-12 DIAGNOSIS — R053 Chronic cough: Secondary | ICD-10-CM | POA: Diagnosis not present

## 2024-11-12 DIAGNOSIS — Z0000 Encounter for general adult medical examination without abnormal findings: Secondary | ICD-10-CM

## 2024-11-12 DIAGNOSIS — G4733 Obstructive sleep apnea (adult) (pediatric): Secondary | ICD-10-CM

## 2024-11-12 LAB — CMP14+EGFR
ALT: 19 IU/L (ref 0–44)
AST: 16 IU/L (ref 0–40)
Albumin: 4 g/dL (ref 3.9–4.9)
Alkaline Phosphatase: 87 IU/L (ref 47–123)
BUN/Creatinine Ratio: 12 (ref 10–24)
BUN: 13 mg/dL (ref 8–27)
Bilirubin Total: 0.6 mg/dL (ref 0.0–1.2)
CO2: 25 mmol/L (ref 20–29)
Calcium: 9.7 mg/dL (ref 8.6–10.2)
Chloride: 102 mmol/L (ref 96–106)
Creatinine, Ser: 1.05 mg/dL (ref 0.76–1.27)
Globulin, Total: 2.6 g/dL (ref 1.5–4.5)
Glucose: 104 mg/dL — ABNORMAL HIGH (ref 70–99)
Potassium: 5.1 mmol/L (ref 3.5–5.2)
Sodium: 141 mmol/L (ref 134–144)
Total Protein: 6.6 g/dL (ref 6.0–8.5)
eGFR: 78 mL/min/1.73

## 2024-11-12 LAB — CBC WITH DIFFERENTIAL/PLATELET
Basophils Absolute: 0 x10E3/uL (ref 0.0–0.2)
Basos: 1 %
EOS (ABSOLUTE): 0.2 x10E3/uL (ref 0.0–0.4)
Eos: 3 %
Hematocrit: 47.3 % (ref 37.5–51.0)
Hemoglobin: 15 g/dL (ref 13.0–17.7)
Immature Grans (Abs): 0 x10E3/uL (ref 0.0–0.1)
Immature Granulocytes: 0 %
Lymphocytes Absolute: 1.5 x10E3/uL (ref 0.7–3.1)
Lymphs: 28 %
MCH: 29.3 pg (ref 26.6–33.0)
MCHC: 31.7 g/dL (ref 31.5–35.7)
MCV: 92 fL (ref 79–97)
Monocytes Absolute: 0.4 x10E3/uL (ref 0.1–0.9)
Monocytes: 7 %
Neutrophils Absolute: 3.1 x10E3/uL (ref 1.4–7.0)
Neutrophils: 61 %
Platelets: 370 x10E3/uL (ref 150–450)
RBC: 5.12 x10E6/uL (ref 4.14–5.80)
RDW: 12.5 % (ref 11.6–15.4)
WBC: 5.2 x10E3/uL (ref 3.4–10.8)

## 2024-11-12 LAB — HEMOGLOBIN A1C
Est. average glucose Bld gHb Est-mCnc: 126 mg/dL
Hgb A1c MFr Bld: 6 % — ABNORMAL HIGH (ref 4.8–5.6)

## 2024-11-12 LAB — LIPID PANEL WITH LDL/HDL RATIO
Cholesterol, Total: 151 mg/dL (ref 100–199)
HDL: 54 mg/dL
LDL Chol Calc (NIH): 78 mg/dL (ref 0–99)
LDL/HDL Ratio: 1.4 ratio (ref 0.0–3.6)
Triglycerides: 102 mg/dL (ref 0–149)
VLDL Cholesterol Cal: 19 mg/dL (ref 5–40)

## 2024-11-12 LAB — PSA: Prostate Specific Ag, Serum: 3.2 ng/mL (ref 0.0–4.0)

## 2024-11-12 NOTE — Progress Notes (Addendum)
 " Daniel Carpenter - 69 y.o. male MRN [id number]  Date of birth: 02-25-1956  Subjective Chief Complaint  Patient presents with   Annual Exam    HPI Daniel Carpenter is a 69 y.o. male here today for annual exam   Recent dx of provoked DVT after recent shoulder surgery and is on Eliquis .  Notes chronic cough and throat clearing.  He does have reflux frequently.   He reports that he is doing pretty well.  He is moderately active.  He feels that his diet is pretty good.    Recent labs reviewed with him.   He is a non-smoker.  No EtOH at this time.   Review of Systems  Constitutional:  Negative for chills, fever, malaise/fatigue and weight loss.  HENT:  Negative for congestion, ear pain and sore throat.   Eyes:  Negative for blurred vision, double vision and pain.  Respiratory:  Negative for cough and shortness of breath.   Cardiovascular:  Negative for chest pain and palpitations.  Gastrointestinal:  Negative for abdominal pain, blood in stool, constipation, heartburn and nausea.  Genitourinary:  Negative for dysuria and urgency.  Musculoskeletal:  Negative for joint pain and myalgias.  Neurological:  Negative for dizziness and headaches.  Endo/Heme/Allergies:  Does not bruise/bleed easily.  Psychiatric/Behavioral:  Negative for depression. The patient is not nervous/anxious and does not have insomnia.     Allergies[1]  Past Medical History:  Diagnosis Date   Actinic keratosis    Diabetes mellitus    boderline   Hyperlipidemia    OSA (obstructive sleep apnea)     Past Surgical History:  Procedure Laterality Date   NASAL SEPTUM SURGERY  1998   ROTATOR CUFF REPAIR  2010   right   VASECTOMY      Social History   Socioeconomic History   Marital status: Married    Spouse name: Not on file   Number of children: 1   Years of education: Not on file   Highest education level: Bachelor's degree (e.g., BA, AB, BS)  Occupational History    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   Tobacco Use   Smoking status: Never   Smokeless tobacco: Never  Vaping Use   Vaping status: Never Used  Substance and Sexual Activity   Alcohol use: No    Alcohol/week: 0.0 standard drinks of alcohol   Drug use: Never   Sexual activity: Yes    Partners: Female  Other Topics Concern   Not on file  Social History Narrative   Not on file   Social Drivers of Health   Tobacco Use: Low Risk (11/12/2024)   Patient History    Smoking Tobacco Use: Never    Smokeless Tobacco Use: Never    Passive Exposure: Not on file  Financial Resource Strain: Patient Declined (10/21/2024)   Overall Financial Resource Strain (CARDIA)    Difficulty of Paying Living Expenses: Patient declined  Food Insecurity: Patient Declined (10/21/2024)   Epic    Worried About Programme Researcher, Broadcasting/film/video in the Last Year: Patient declined    Barista in the Last Year: Patient declined  Transportation Needs: No Transportation Needs (10/21/2024)   Epic    Lack of Transportation (Medical): No    Lack of Transportation (Non-Medical): No  Physical Activity: Insufficiently Active (10/21/2024)   Exercise Vital Sign    Days of Exercise per Week: 2 days    Minutes of Exercise per Session: 30 min  Stress: No Stress Concern Present (10/21/2024)  [employer] of Occupational Health - Occupational Stress Questionnaire    Feeling of Stress: Not at all  Social Connections: Unknown (10/21/2024)   Social Connection and Isolation Panel    Frequency of Communication with Friends and Family: Patient declined    Frequency of Social Gatherings with Friends and Family: Patient declined    Attends Religious Services: Patient declined    Active Member of Clubs or Organizations: Patient declined    Attends Banker Meetings: Not on file    Marital Status: Married  Depression (PHQ2-9): Low Risk (10/21/2024)   Depression (PHQ2-9)    PHQ-2 Score: 1  Alcohol Screen: Low Risk (10/21/2024)   Alcohol Screen    Last  Alcohol Screening Score (AUDIT): 1  Housing: Patient Declined (10/21/2024)   Epic    Unable to Pay for Housing in the Last Year: Patient declined    Number of Times Moved in the Last Year: Not on file    Homeless in the Last Year: Patient declined  Utilities: Not on file  Health Literacy: Not on file    Family History  Problem Relation Age of Onset   Emphysema Father    Heart disease Father    Heart attack Mother    Heart disease Mother    Diabetes Sister     Health Maintenance  Topic Date Due   Medicare Annual Wellness (AWV)  Never done   Hepatitis C Screening  Never done   Influenza Vaccine  01/28/2025 (Originally 05/31/2024)   Zoster Vaccines- Shingrix (1 of 2) 01/30/2025 (Originally 04/17/1976)   COVID-19 Vaccine (3 - 2025-26 season) 06/30/2025 (Originally 07/01/2024)   Pneumococcal Vaccine: 50+ Years (2 of 2 - PCV20 or PCV21) 11/12/2025 (Originally 10/04/2019)   DTaP/Tdap/Td (2 - Td or Tdap) 09/28/2027   Colonoscopy  12/30/2028   Meningococcal B Vaccine  Aged Out     ----------------------------------------------------------------------------------------------------------------------------------------------------------------------------------------------------------------- Physical Exam BP 125/70 (BP Location: Left Arm, Patient Position: Sitting, Cuff Size: Normal)   Pulse 66   Ht 5' 11 (1.803 m)   Wt 196 lb (88.9 kg)   SpO2 95%   BMI 27.34 kg/m   Physical Exam Constitutional:      General: He is not in acute distress. HENT:     Head: Normocephalic and atraumatic.     Right Ear: Tympanic membrane and external ear normal.     Left Ear: Tympanic membrane and external ear normal.  Eyes:     General: No scleral icterus. Neck:     Thyroid : No thyromegaly.  Cardiovascular:     Rate and Rhythm: Normal rate and regular rhythm.     Heart sounds: Normal heart sounds.  Pulmonary:     Effort: Pulmonary effort is normal.     Breath sounds: Normal breath sounds.   Abdominal:     General: Bowel sounds are normal. There is no distension.     Palpations: Abdomen is soft.     Tenderness: There is no abdominal tenderness. There is no guarding.  Musculoskeletal:     Cervical back: Normal range of motion.  Lymphadenopathy:     Cervical: No cervical adenopathy.  Skin:    General: Skin is warm and dry.     Findings: No rash.  Neurological:     Mental Status: He is alert and oriented to person, place, and time.     Cranial Nerves: No cranial nerve deficit.     Motor: No abnormal muscle tone.  Psychiatric:        Mood and  Affect: Mood normal.        Behavior: Behavior normal.     ------------------------------------------------------------------------------------------------------------------------------------------------------------------------------------------------------------------- Assessment and Plan  Physical exam, annual Well adult Recent labs reviewed with him Screenings: UTD Immunizations: UTD Anticipatory guidance/Risk factor reduction:  recommendations per AVS.    Chronic cough Orders Placed This Encounter  Procedures   DG Chest 2 View    Standing Status:   Future    Expiration Date:   11/12/2025    Reason for Exam (SYMPTOM  OR DIAGNOSIS REQUIRED):   chronic cough    Preferred imaging location?:   [facility]     Obstructive sleep apnea Continues use of CPAP.  Doing well and benefiting from use.  Recommend continuation.    No orders of the defined types were placed in this encounter.   No follow-ups on file.        [1] No Known Allergies  "

## 2024-11-12 NOTE — Assessment & Plan Note (Signed)
 Orders Placed This Encounter  Procedures   DG Chest 2 View    Standing Status:   Future    Expiration Date:   11/12/2025    Reason for Exam (SYMPTOM  OR DIAGNOSIS REQUIRED):   chronic cough    Preferred imaging location?:   [facility]

## 2024-11-12 NOTE — Assessment & Plan Note (Signed)
 Well adult Recent labs reviewed with him Screenings: UTD Immunizations: UTD Anticipatory guidance/Risk factor reduction:  recommendations per AVS.

## 2024-11-12 NOTE — Patient Instructions (Signed)
 Preventive Care 73 Years and Older, Male Preventive care refers to lifestyle choices and visits with your health care provider that can promote health and wellness. Preventive care visits are also called wellness exams. What can I expect for my preventive care visit? Counseling During your preventive care visit, your health care provider may ask about your: Medical history, including: Past medical problems. Family medical history. History of falls. Current health, including: Emotional well-being. Home life and relationship well-being. Sexual activity. Memory and ability to understand (cognition). Lifestyle, including: Alcohol, nicotine or tobacco, and drug use. Access to firearms. Diet, exercise, and sleep habits. Work and work Astronomer. Sunscreen use. Safety issues such as seatbelt and bike helmet use. Physical exam Your health care provider will check your: Height and weight. These may be used to calculate your BMI (body mass index). BMI is a measurement that tells if you are at a healthy weight. Waist circumference. This measures the distance around your waistline. This measurement also tells if you are at a healthy weight and may help predict your risk of certain diseases, such as type 2 diabetes and high blood pressure. Heart rate and blood pressure. Body temperature. Skin for abnormal spots. What immunizations do I need?  Vaccines are usually given at various ages, according to a schedule. Your health care provider will recommend vaccines for you based on your age, medical history, and lifestyle or other factors, such as travel or where you work. What tests do I need? Screening Your health care provider may recommend screening tests for certain conditions. This may include: Lipid and cholesterol levels. Diabetes screening. This is done by checking your blood sugar (glucose) after you have not eaten for a while (fasting). Hepatitis C test. Hepatitis B test. HIV (human  immunodeficiency virus) test. STI (sexually transmitted infection) testing, if you are at risk. Lung cancer screening. Colorectal cancer screening. Prostate cancer screening. Abdominal aortic aneurysm (AAA) screening. You may need this if you are a current or former smoker. Talk with your health care provider about your test results, treatment options, and if necessary, the need for more tests. Follow these instructions at home: Eating and drinking  Eat a diet that includes fresh fruits and vegetables, whole grains, lean protein, and low-fat dairy products. Limit your intake of foods with high amounts of sugar, saturated fats, and salt. Take vitamin and mineral supplements as recommended by your health care provider. Do not drink alcohol if your health care provider tells you not to drink. If you drink alcohol: Limit how much you have to 0-2 drinks a day. Know how much alcohol is in your drink. In the [place] one drink equals one 12 oz bottle of beer (355 mL), one 5 oz glass of wine (148 mL), or one 1 oz glass of hard liquor (44 mL). Lifestyle Brush your teeth every morning and night with fluoride toothpaste. Floss one time each day. Exercise for at least 30 minutes 5 or more days each week. Do not use any products that contain nicotine or tobacco. These products include cigarettes, chewing tobacco, and vaping devices, such as e-cigarettes. If you need help quitting, ask your health care provider. Do not use drugs. If you are sexually active, practice safe sex. Use a condom or other form of protection to prevent STIs. Take aspirin only as told by your health care provider. Make sure that you understand how much to take and what form to take. Work with your health care provider to find out whether it is safe  and beneficial for you to take aspirin daily. Ask your health care provider if you need to take a cholesterol-lowering medicine (statin). Find healthy ways to manage stress, such  as: Meditation, yoga, or listening to music. Journaling. Talking to a trusted person. Spending time with friends and family. Safety Always wear your seat belt while driving or riding in a vehicle. Do not drive: If you have been drinking alcohol. Do not ride with someone who has been drinking. When you are tired or distracted. While texting. If you have been using any mind-altering substances or drugs. Wear a helmet and other protective equipment during sports activities. If you have firearms in your house, make sure you follow all gun safety procedures. Minimize exposure to UV radiation to reduce your risk of skin cancer. What's next? Visit your health care provider once a year for an annual wellness visit. Ask your health care provider how often you should have your eyes and teeth checked. Stay up to date on all vaccines. This information is not intended to replace advice given to you by your health care provider. Make sure you discuss any questions you have with your health care provider. Document Revised: 04/14/2021 Document Reviewed: 04/14/2021 Elsevier Patient Education  2024 [employer].

## 2024-11-13 ENCOUNTER — Encounter: Payer: Self-pay | Admitting: Family Medicine

## 2024-11-17 ENCOUNTER — Encounter: Payer: Self-pay | Admitting: Family Medicine

## 2024-11-18 ENCOUNTER — Ambulatory Visit: Payer: Self-pay | Admitting: Medical-Surgical

## 2024-11-18 ENCOUNTER — Encounter: Payer: Medicare Other | Admitting: Family Medicine

## 2024-11-22 NOTE — Assessment & Plan Note (Signed)
 Continues use of CPAP.  Doing well and benefiting from use.  Recommend continuation.

## 2024-11-29 ENCOUNTER — Telehealth: Payer: Self-pay | Admitting: Family Medicine

## 2024-11-29 NOTE — Telephone Encounter (Signed)
 Looks like he needs a visit and a new prescription stating a CPAP and supplies.

## 2024-11-29 NOTE — Telephone Encounter (Signed)
 Copied from CRM [id number]. Topic: General - Other >> Nov 29, 2024  9:23 AM Joesph NOVAK wrote: Reason for CRM: [facility] is calling about Missing prescription for pap supplies.  Needs a prescription that says patient needs supplies. Needs visit notes from the last 12 months,   PH: [phone]  FAX: [phone]

## 2024-11-29 NOTE — Telephone Encounter (Signed)
 Patients needs updated CPAP orders with supplies. Please see note below.

## 2024-12-05 ENCOUNTER — Encounter: Payer: Self-pay | Admitting: Family Medicine

## 2024-12-05 ENCOUNTER — Ambulatory Visit: Admitting: Family Medicine

## 2024-12-05 VITALS — BP 120/71 | HR 60 | Ht 71.0 in | Wt 198.0 lb

## 2024-12-05 DIAGNOSIS — G4733 Obstructive sleep apnea (adult) (pediatric): Secondary | ICD-10-CM | POA: Diagnosis not present

## 2024-12-05 NOTE — Progress Notes (Signed)
 " Daniel Carpenter - 69 y.o. male MRN [id number]  Date of birth: May 01, 1956  Subjective Chief Complaint  Patient presents with   Obstructive Sleep Apnea    HPI Daniel Carpenter is a 69 year old male here today for follow-up.  History of obstructive sleep apnea and uses CPAP.  He needs updated prescription for supplies for CPAP.  He is compliant with use of CPAP and uses nightly.  He does get benefit from this with restful sleep as well as reduction in AHI.  He is tolerating this well with current settings.    ROS:  A comprehensive ROS was completed and negative except as noted per HPI  Past Medical History:  Diagnosis Date   Actinic keratosis    Diabetes mellitus    boderline   Hyperlipidemia    OSA (obstructive sleep apnea)     Past Surgical History:  Procedure Laterality Date   NASAL SEPTUM SURGERY  1998   ROTATOR CUFF REPAIR  2010   right   VASECTOMY      Social History   Socioeconomic History   Marital status: Married    Spouse name: Not on file   Number of children: 1   Years of education: Not on file   Highest education level: Bachelor's degree (e.g., BA, AB, BS)  Occupational History    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  Tobacco Use   Smoking status: Never   Smokeless tobacco: Never  Vaping Use   Vaping status: Never Used  Substance and Sexual Activity   Alcohol use: No    Alcohol/week: 0.0 standard drinks of alcohol   Drug use: Never   Sexual activity: Yes    Partners: Female  Other Topics Concern   Not on file  Social History Narrative   Not on file   Social Drivers of Health   Tobacco Use: Low Risk (11/12/2024)   Patient History    Smoking Tobacco Use: Never    Smokeless Tobacco Use: Never    Passive Exposure: Not on file  Financial Resource Strain: Patient Declined (10/21/2024)   Overall Financial Resource Strain (CARDIA)    Difficulty of Paying Living Expenses: Patient declined  Food Insecurity: Patient Declined (10/21/2024)   Epic    Worried  About Programme Researcher, Broadcasting/film/video in the Last Year: Patient declined    Barista in the Last Year: Patient declined  Transportation Needs: No Transportation Needs (10/21/2024)   Epic    Lack of Transportation (Medical): No    Lack of Transportation (Non-Medical): No  Physical Activity: Insufficiently Active (10/21/2024)   Exercise Vital Sign    Days of Exercise per Week: 2 days    Minutes of Exercise per Session: 30 min  Stress: No Stress Concern Present (10/21/2024)   [employer] - Occupational Stress Questionnaire    Feeling of Stress: Not at all  Social Connections: Unknown (10/21/2024)   Social Connection and Isolation Panel    Frequency of Communication with Friends and Family: Patient declined    Frequency of Social Gatherings with Friends and Family: Patient declined    Attends Religious Services: Patient declined    Active Member of Clubs or Organizations: Patient declined    Attends Banker Meetings: Not on file    Marital Status: Married  Depression (PHQ2-9): Low Risk (10/21/2024)   Depression (PHQ2-9)    PHQ-2 Score: 1  Alcohol Screen: Low Risk (10/21/2024)   Alcohol Screen    Last Alcohol Screening Score (AUDIT): 1  Housing: Patient Declined (10/21/2024)   Epic    Unable to Pay for Housing in the Last Year: Patient declined    Number of Times Moved in the Last Year: Not on file    Homeless in the Last Year: Patient declined  Utilities: Not on file  Health Literacy: Not on file    Family History  Problem Relation Age of Onset   Emphysema Father    Heart disease Father    Heart attack Mother    Heart disease Mother    Diabetes Sister     Health Maintenance  Topic Date Due   Medicare Annual Wellness (AWV)  Never done   Hepatitis C Screening  Never done   Influenza Vaccine  01/28/2025 (Originally 05/31/2024)   Zoster Vaccines- Shingrix (1 of 2) 01/30/2025 (Originally 04/17/1976)   COVID-19 Vaccine (3 - 2025-26 season)  06/30/2025 (Originally 07/01/2024)   Pneumococcal Vaccine: 50+ Years (2 of 2 - PCV20 or PCV21) 11/12/2025 (Originally 10/04/2019)   DTaP/Tdap/Td (2 - Td or Tdap) 09/28/2027   Colonoscopy  12/30/2028   Meningococcal B Vaccine  Aged Out     ----------------------------------------------------------------------------------------------------------------------------------------------------------------------------------------------------------------- Physical Exam BP 120/71 (BP Location: Left Arm, Patient Position: Sitting, Cuff Size: Normal)   Pulse 60   Ht 5' 11 (1.803 m)   Wt 198 lb (89.8 kg)   SpO2 96%   BMI 27.62 kg/m   Physical Exam Constitutional:      Appearance: Normal appearance.  HENT:     Head: Normocephalic and atraumatic.  Cardiovascular:     Rate and Rhythm: Normal rate and regular rhythm.  Pulmonary:     Effort: Pulmonary effort is normal.     Breath sounds: Normal breath sounds.  Musculoskeletal:     Cervical back: Neck supple.  Neurological:     Mental Status: He is alert.  Psychiatric:        Mood and Affect: Mood normal.     ------------------------------------------------------------------------------------------------------------------------------------------------------------------------------------------------------------------- Assessment and Plan  Obstructive sleep apnea Reviewed compliance data.  He is using 100% of the time each night.  Using for approximately 7 hours each night.  AHI is 0.9.  He is tolerating this well with good effect at this time.   No orders of the defined types were placed in this encounter.   No follow-ups on file.     "

## 2024-12-05 NOTE — Assessment & Plan Note (Signed)
 Reviewed compliance data.  He is using 100% of the time each night.  Using for approximately 7 hours each night.  AHI is 0.9.  He is tolerating this well with good effect at this time.

## 2024-12-06 MED ORDER — AMBULATORY NON FORMULARY MEDICATION: 99 refills | Status: AC

## 2025-11-13 ENCOUNTER — Encounter: Admitting: Family Medicine
